# Patient Record
Sex: Female | Born: 1970 | Race: White | Hispanic: No | Smoking: Never smoker
Health system: Southern US, Community
[De-identification: ages and names within clinical notes are randomized; demographics above are authoritative.]

## PROBLEM LIST (undated history)

## (undated) DIAGNOSIS — G43909 Migraine, unspecified, not intractable, without status migrainosus: Secondary | ICD-10-CM

## (undated) HISTORY — PX: ABDOMINAL HYSTERECTOMY: SHX81

## (undated) HISTORY — PX: CHOLECYSTECTOMY: SHX55

---

## 1998-02-27 ENCOUNTER — Encounter: Admission: RE | Admit: 1998-02-27 | Discharge: 1998-05-28 | Payer: Self-pay | Admitting: Gynecology

## 1998-03-16 ENCOUNTER — Inpatient Hospital Stay (HOSPITAL_COMMUNITY): Admission: AD | Admit: 1998-03-16 | Discharge: 1998-03-16 | Payer: Self-pay | Admitting: Obstetrics and Gynecology

## 1998-03-18 ENCOUNTER — Inpatient Hospital Stay (HOSPITAL_COMMUNITY): Admission: AD | Admit: 1998-03-18 | Discharge: 1998-03-20 | Payer: Self-pay | Admitting: Gynecology

## 1998-04-30 ENCOUNTER — Other Ambulatory Visit: Admission: RE | Admit: 1998-04-30 | Discharge: 1998-04-30 | Payer: Self-pay | Admitting: Obstetrics and Gynecology

## 1999-07-25 ENCOUNTER — Other Ambulatory Visit: Admission: RE | Admit: 1999-07-25 | Discharge: 1999-07-25 | Payer: Self-pay | Admitting: Gynecology

## 1999-08-14 ENCOUNTER — Encounter: Admission: RE | Admit: 1999-08-14 | Discharge: 1999-11-12 | Payer: Self-pay | Admitting: Gynecology

## 2000-01-01 ENCOUNTER — Encounter: Payer: Self-pay | Admitting: Gynecology

## 2000-01-01 ENCOUNTER — Ambulatory Visit (HOSPITAL_COMMUNITY): Admission: RE | Admit: 2000-01-01 | Discharge: 2000-01-01 | Payer: Self-pay | Admitting: Gynecology

## 2000-01-14 ENCOUNTER — Inpatient Hospital Stay (HOSPITAL_COMMUNITY): Admission: AD | Admit: 2000-01-14 | Discharge: 2000-01-14 | Payer: Self-pay | Admitting: Gynecology

## 2000-01-28 ENCOUNTER — Inpatient Hospital Stay (HOSPITAL_COMMUNITY): Admission: RE | Admit: 2000-01-28 | Discharge: 2000-01-28 | Payer: Self-pay | Admitting: Gynecology

## 2000-01-28 ENCOUNTER — Encounter: Payer: Self-pay | Admitting: Gynecology

## 2000-02-03 ENCOUNTER — Inpatient Hospital Stay (HOSPITAL_COMMUNITY): Admission: AD | Admit: 2000-02-03 | Discharge: 2000-02-03 | Payer: Self-pay | Admitting: Gynecology

## 2000-02-09 ENCOUNTER — Inpatient Hospital Stay (HOSPITAL_COMMUNITY): Admission: AD | Admit: 2000-02-09 | Discharge: 2000-02-11 | Payer: Self-pay | Admitting: Gynecology

## 2000-03-22 ENCOUNTER — Other Ambulatory Visit: Admission: RE | Admit: 2000-03-22 | Discharge: 2000-03-22 | Payer: Self-pay | Admitting: Gynecology

## 2000-10-21 ENCOUNTER — Encounter: Payer: Self-pay | Admitting: Family Medicine

## 2000-10-21 ENCOUNTER — Ambulatory Visit (HOSPITAL_COMMUNITY): Admission: RE | Admit: 2000-10-21 | Discharge: 2000-10-21 | Payer: Self-pay | Admitting: Family Medicine

## 2001-06-06 ENCOUNTER — Other Ambulatory Visit: Admission: RE | Admit: 2001-06-06 | Discharge: 2001-06-06 | Payer: Self-pay | Admitting: Gynecology

## 2002-07-20 ENCOUNTER — Other Ambulatory Visit: Admission: RE | Admit: 2002-07-20 | Discharge: 2002-07-20 | Payer: Self-pay | Admitting: Gynecology

## 2002-09-13 ENCOUNTER — Encounter: Payer: Self-pay | Admitting: Family Medicine

## 2002-09-13 ENCOUNTER — Ambulatory Visit (HOSPITAL_COMMUNITY): Admission: RE | Admit: 2002-09-13 | Discharge: 2002-09-13 | Payer: Self-pay | Admitting: Family Medicine

## 2002-09-22 ENCOUNTER — Encounter (HOSPITAL_COMMUNITY): Admission: RE | Admit: 2002-09-22 | Discharge: 2002-10-22 | Payer: Self-pay | Admitting: Family Medicine

## 2002-09-22 ENCOUNTER — Encounter: Payer: Self-pay | Admitting: Family Medicine

## 2002-10-20 ENCOUNTER — Ambulatory Visit (HOSPITAL_COMMUNITY): Admission: RE | Admit: 2002-10-20 | Discharge: 2002-10-20 | Payer: Self-pay | Admitting: Internal Medicine

## 2002-10-24 ENCOUNTER — Ambulatory Visit (HOSPITAL_COMMUNITY): Admission: RE | Admit: 2002-10-24 | Discharge: 2002-10-24 | Payer: Self-pay | Admitting: Internal Medicine

## 2002-10-24 ENCOUNTER — Encounter (INDEPENDENT_AMBULATORY_CARE_PROVIDER_SITE_OTHER): Payer: Self-pay | Admitting: Internal Medicine

## 2003-03-29 ENCOUNTER — Encounter: Payer: Self-pay | Admitting: Family Medicine

## 2003-03-29 ENCOUNTER — Ambulatory Visit (HOSPITAL_COMMUNITY): Admission: RE | Admit: 2003-03-29 | Discharge: 2003-03-29 | Payer: Self-pay | Admitting: Family Medicine

## 2003-07-25 ENCOUNTER — Other Ambulatory Visit: Admission: RE | Admit: 2003-07-25 | Discharge: 2003-07-25 | Payer: Self-pay | Admitting: Gynecology

## 2004-04-02 ENCOUNTER — Ambulatory Visit (HOSPITAL_COMMUNITY): Admission: RE | Admit: 2004-04-02 | Discharge: 2004-04-02 | Payer: Self-pay | Admitting: Internal Medicine

## 2004-09-11 ENCOUNTER — Ambulatory Visit (HOSPITAL_COMMUNITY): Admission: RE | Admit: 2004-09-11 | Discharge: 2004-09-11 | Payer: Self-pay | Admitting: Family Medicine

## 2004-09-29 ENCOUNTER — Other Ambulatory Visit: Admission: RE | Admit: 2004-09-29 | Discharge: 2004-09-29 | Payer: Self-pay | Admitting: Gynecology

## 2005-09-16 ENCOUNTER — Ambulatory Visit: Payer: Self-pay | Admitting: Internal Medicine

## 2005-09-30 ENCOUNTER — Other Ambulatory Visit: Admission: RE | Admit: 2005-09-30 | Discharge: 2005-09-30 | Payer: Self-pay | Admitting: Gynecology

## 2006-06-11 ENCOUNTER — Ambulatory Visit (HOSPITAL_COMMUNITY): Admission: RE | Admit: 2006-06-11 | Discharge: 2006-06-11 | Payer: Self-pay | Admitting: Family Medicine

## 2006-10-04 ENCOUNTER — Other Ambulatory Visit: Admission: RE | Admit: 2006-10-04 | Discharge: 2006-10-04 | Payer: Self-pay | Admitting: Gynecology

## 2007-01-19 ENCOUNTER — Emergency Department (HOSPITAL_COMMUNITY): Admission: RE | Admit: 2007-01-19 | Discharge: 2007-01-19 | Payer: Self-pay | Admitting: Family Medicine

## 2007-10-06 ENCOUNTER — Other Ambulatory Visit: Admission: RE | Admit: 2007-10-06 | Discharge: 2007-10-06 | Payer: Self-pay | Admitting: Gynecology

## 2007-12-06 ENCOUNTER — Encounter: Payer: Self-pay | Admitting: Obstetrics and Gynecology

## 2007-12-06 ENCOUNTER — Ambulatory Visit (HOSPITAL_BASED_OUTPATIENT_CLINIC_OR_DEPARTMENT_OTHER): Admission: RE | Admit: 2007-12-06 | Discharge: 2007-12-07 | Payer: Self-pay | Admitting: Obstetrics and Gynecology

## 2007-12-28 ENCOUNTER — Ambulatory Visit (HOSPITAL_COMMUNITY): Admission: RE | Admit: 2007-12-28 | Discharge: 2007-12-28 | Payer: Self-pay | Admitting: Family Medicine

## 2008-07-16 ENCOUNTER — Ambulatory Visit (HOSPITAL_COMMUNITY): Admission: RE | Admit: 2008-07-16 | Discharge: 2008-07-16 | Payer: Self-pay | Admitting: Family Medicine

## 2008-07-23 ENCOUNTER — Ambulatory Visit (HOSPITAL_COMMUNITY): Admission: RE | Admit: 2008-07-23 | Discharge: 2008-07-23 | Payer: Self-pay | Admitting: Family Medicine

## 2008-10-01 ENCOUNTER — Ambulatory Visit (HOSPITAL_COMMUNITY): Admission: RE | Admit: 2008-10-01 | Discharge: 2008-10-01 | Payer: Self-pay | Admitting: General Surgery

## 2008-10-01 ENCOUNTER — Encounter (INDEPENDENT_AMBULATORY_CARE_PROVIDER_SITE_OTHER): Payer: Self-pay | Admitting: General Surgery

## 2008-12-28 ENCOUNTER — Inpatient Hospital Stay (HOSPITAL_COMMUNITY): Admission: EM | Admit: 2008-12-28 | Discharge: 2008-12-30 | Payer: Self-pay | Admitting: Emergency Medicine

## 2008-12-28 ENCOUNTER — Ambulatory Visit: Payer: Self-pay | Admitting: Gastroenterology

## 2008-12-29 ENCOUNTER — Ambulatory Visit: Payer: Self-pay | Admitting: Gastroenterology

## 2008-12-31 ENCOUNTER — Encounter: Payer: Self-pay | Admitting: Gastroenterology

## 2010-10-13 LAB — DIFFERENTIAL
Basophils Absolute: 0 10*3/uL (ref 0.0–0.1)
Basophils Relative: 0 % (ref 0–1)
Eosinophils Absolute: 0.1 10*3/uL (ref 0.0–0.7)
Lymphocytes Relative: 14 % (ref 12–46)
Lymphocytes Relative: 31 % (ref 12–46)
Lymphs Abs: 1.2 10*3/uL (ref 0.7–4.0)
Monocytes Relative: 5 % (ref 3–12)
Neutro Abs: 2.9 10*3/uL (ref 1.7–7.7)
Neutrophils Relative %: 80 % — ABNORMAL HIGH (ref 43–77)

## 2010-10-13 LAB — CBC
Hemoglobin: 14.4 g/dL (ref 12.0–15.0)
MCHC: 35.1 g/dL (ref 30.0–36.0)
Platelets: 172 10*3/uL (ref 150–400)
RDW: 13.6 % (ref 11.5–15.5)
RDW: 14 % (ref 11.5–15.5)
WBC: 5 10*3/uL (ref 4.0–10.5)

## 2010-10-13 LAB — COMPREHENSIVE METABOLIC PANEL
ALT: 27 U/L (ref 0–35)
Calcium: 9.3 mg/dL (ref 8.4–10.5)
GFR calc Af Amer: >60 mL/min (ref >60)
Glucose, Bld: 110 mg/dL — ABNORMAL HIGH (ref 70–99)
Sodium: 138 mEq/L (ref 135–145)
Total Protein: 6.8 g/dL (ref 6.0–8.3)

## 2010-10-13 LAB — BASIC METABOLIC PANEL
BUN: 4 mg/dL — ABNORMAL LOW (ref 6–23)
Calcium: 8.3 mg/dL — ABNORMAL LOW (ref 8.4–10.5)
Creatinine, Ser: 0.59 mg/dL (ref 0.4–1.2)
GFR calc non Af Amer: >60 mL/min (ref >60)
Potassium: 4.1 mEq/L (ref 3.5–5.1)

## 2010-10-13 LAB — URINALYSIS, ROUTINE W REFLEX MICROSCOPIC
Bilirubin Urine: NEGATIVE
Ketones, ur: 40 mg/dL — AB
Nitrite: NEGATIVE
Urobilinogen, UA: 0.2 mg/dL (ref 0.0–1.0)

## 2010-10-13 LAB — SEDIMENTATION RATE
Sed Rate: 1 mm/hr (ref 0–22)
Sed Rate: 5 mm/hr (ref 0–22)

## 2010-10-13 LAB — APTT: aPTT: 29 seconds (ref 24–37)

## 2010-10-13 LAB — PROTIME-INR
INR: 1 (ref 0.00–1.49)
Prothrombin Time: 13.3 seconds (ref 11.6–15.2)

## 2010-10-13 LAB — PREGNANCY, URINE: Preg Test, Ur: NEGATIVE

## 2010-10-13 LAB — HEPATIC FUNCTION PANEL
AST: 17 U/L (ref 0–37)
Albumin: 3.1 g/dL — ABNORMAL LOW (ref 3.5–5.2)
Bilirubin, Direct: 0.2 mg/dL (ref 0.0–0.3)

## 2010-10-13 LAB — C-REACTIVE PROTEIN
CRP: 0.2 mg/dL — ABNORMAL LOW (ref <0.6)
CRP: 0.2 mg/dL — ABNORMAL LOW (ref <0.6)

## 2010-10-16 LAB — TSH: TSH: 0.615 u[IU]/mL (ref 0.350–4.500)

## 2010-10-16 LAB — CBC
HCT: 34.6 % — ABNORMAL LOW (ref 36.0–46.0)
Hemoglobin: 12.1 g/dL (ref 12.0–15.0)
Platelets: 239 10*3/uL (ref 150–400)
RBC: 3.77 MIL/uL — ABNORMAL LOW (ref 3.87–5.11)
WBC: 5.1 10*3/uL (ref 4.0–10.5)

## 2010-10-16 LAB — BASIC METABOLIC PANEL
CO2: 27 mEq/L (ref 19–32)
Calcium: 9.2 mg/dL (ref 8.4–10.5)
GFR calc Af Amer: >60 mL/min (ref >60)
GFR calc non Af Amer: >60 mL/min (ref >60)
Glucose, Bld: 111 mg/dL — ABNORMAL HIGH (ref 70–99)
Potassium: 3.9 mEq/L (ref 3.5–5.1)
Sodium: 139 mEq/L (ref 135–145)

## 2010-10-16 LAB — HEPATIC FUNCTION PANEL
ALT: 22 U/L (ref 0–35)
AST: 26 U/L (ref 0–37)
Alkaline Phosphatase: 57 U/L (ref 39–117)
Bilirubin, Direct: 0.1 mg/dL (ref 0.0–0.3)

## 2010-11-18 NOTE — Op Note (Signed)
NAME:  Angel Wood, Angel Wood           ACCOUNT NO.:  [id number]   MEDICAL RECORD NO.:  [id number]          PATIENT TYPE:  AMB   LOCATION:  [facility]                         FACILITY:  [facility]   PHYSICIAN:  Daniel L. Gottsegen, M.D.DATE OF BIRTH:  May 25, 1971   DATE OF PROCEDURE:  12/06/2007  DATE OF DISCHARGE:                               OPERATIVE REPORT   PREOPERATIVE DIAGNOSES:  1. Uterine prolapse.  2. Cystocele.  3. Rectocele.   POSTOPERATIVE DIAGNOSES:  1. Uterine prolapse.  2. Cystocele.  3. Rectocele.   OPERATIONS:  1. Vaginal hysterectomy.  2. McCall's culdoplasty.  3. Anterior and posterior repair.  4. Repair of perineal body   SURGEON:  Daniel L. Eda Paschal, M.D.   FIRST ASSISTANT:  Juan H. Lily Peer, M.D.   INDICATIONS:  The patient is a 64 year old gravida 2, para 2, AB 0 who  had presented to the office with symptoms of uterine prolapse and  rectocele.  Symptoms included dyspareunia, feeling that her vagina had  stretched, some difficulty eliminating stool and feeling of the uterus  having dropped.  She had no specific bladder symptoms although she did  have a cystocele.  She was sent to the urologist who did not feel that a  sling procedure was appropriate as she had no stress incontinence and  did not feel that after we repaired all the above that she would have  incontinence.   FINDINGS:  Uterus was normal size and shape with second-degree prolapse.  The patient had a very lax anterior wall with a one and a half to second-  degree cystocele.  Her urethrovesical angle was well supported.  Her cul-  de-sac was large, wide and it appeared that she had the potential for an  enterocele postoperatively.  She had poor posterior support including a  rectocele and poor perineal body support.   PROCEDURE:  After adequate general endotracheal anesthesia, the patient  was placed in the dorsal lithotomy position, prepped and draped in usual  sterile manner.  A 1:200,000  solution of epinephrine and 0.5% Xylocaine  was injected around the cervix.  A 360 degree incision was made around  the cervix.  The bladder was mobilized superiorly as was the posterior  peritoneum.  Posterior peritoneum and vesicouterine fold of peritoneum  were entered with sharp dissection.  Uterosacral ligaments were clamped.  In clamping them, they were shortened and they were sutured to the vault  laterally for good vault support.  Cardinal ligaments, uterine arteries,  balance of the broad ligament, utero-ovarian ligaments and round  ligaments were successfully clamped, cut and suture ligated as was the  fallopian tubes.  Suture material was #1 chromic catgut.  Major vascular  bundles were doubly ligated.  The uterus was delivered and sent to  pathology for tissue diagnosis.  The vaginal cuff was sutured to the  posterior peritoneum with a running locking 0 Vicryl.  Copious  irrigation was done with sterile Ringer's lactate.  At this point, it  was felt that the patient needed support to prevent an enterocele.  Modified McCall's enterocele prevention sutures were placed with 3-0  Vicryl.  Three of  these were placed in order to eliminate the cul-de-sac  and this worked well.  They were not tied in place yet but instead  attention was turned to the anterior repair.  The anterior wall was  undermined to a level just below the urethrovesical angle.  A Foley  catheter was inserted which drained clear urine.  The cystocele and  vesicovaginal fascia was dissected free from the mucosa.  Excessive  mucosal skin was trimmed away and then the cystocele was reduced with  interrupteds of 2-0 Vicryl incorporating good fascia.  The anterior wall  was then closed with another 2-0 Vicryl also picking up the fascia to  eliminate dead space and to reinforce the repair.  At this point, the  vaginal cuff was then closed with figure-of-eights of #1 chromic catgut.  All the enterocele sutures were tied  in place and attention was next  turned to the posterior repair.  The patient had a poor perineal body as  well as a rectocele.  The vaginal mucosa was undermined to the top of  the cuff.  The perirectal fascia and the rectocele were sharply  dissected free.  Some of this dissection was done with the surgeon's  hand in the rectum and then he changed gloves.  The rectovaginal fascia  was brought together with interrupted 2-0 Vicryl after redundant vaginal  mucosa was trimmed away.  The posterior vaginal mucosa was then closed,  once again incorporating perirectal fascia to reduce dead space and to  support the repair.  When the perineum was gotten to, the perineal body  was then brought back together with interrupted 2-0 Vicryl and so, at  the termination of procedure, she had a good perineal body.  The skin  edges were brought together with subcuticular 3-0 Vicryl sutures.  Blood  loss for the entire procedure was 200 mL with none replaced.  At the  termination of the procedure, a pack was placed with 2-inch Iodoform.  She continued to drain clear urine throughout the procedure.  She left  the operating room in satisfactory condition.      Daniel L. Eda Paschal, M.D.  Electronically Signed     DLG/MEDQ  D:  12/06/2007  T:  12/06/2007  Job:  [id number]

## 2010-11-18 NOTE — Op Note (Signed)
NAME:  Angel Wood, Angel Wood NO.:  [id number]   MEDICAL RECORD NO.:  [id number]          PATIENT TYPE:  AMB   LOCATION:  DAY                           FACILITY:  [facility]   PHYSICIAN:  Dalia Heading, M.D.  DATE OF BIRTH:  1970/11/22   DATE OF PROCEDURE:  10/01/2008  DATE OF DISCHARGE:                               OPERATIVE REPORT   PREOPERATIVE DIAGNOSES:  Cholecystitis, cholelithiasis.   POSTOPERATIVE DIAGNOSES:  Cholecystitis, cholelithiasis.   PROCEDURE:  Laparoscopic cholecystectomy.   SURGEON:  Dalia Heading, MD   ANESTHESIA:  General endotracheal.   INDICATIONS:  The patient is a 40 year old white female who is referred  for evaluation and treatment of biliary colic secondary to  cholelithiasis.  The risks and benefits of the procedure including  bleeding, infection, hepatobiliary injury, and the possibly of an open  procedure were fully explained to the patient, gave informed consent.   PROCEDURE NOTE:  The patient was placed in the supine position.  After  induction of general endotracheal anesthesia, the abdomen was prepped  and draped in the usual sterile technique with DuraPrep.  Surgical site  confirmation was performed.   An infraumbilical incision was made down to the fascia.  A Veress needle  was introduced into the abdominal cavity and confirmation of placement  was done using the saline drop test.  The abdomen was then insufflated  to 16 mmHg pressure.  An 11-mm trocar was introduced into the abdominal  cavity under direct visualization without difficulty.  The patient was  placed in reverse Trendelenburg position.  An additional 11-mm trocar  was placed in the epigastric region and 5-mm trocars were placed in the  right upper quadrant right flank regions.  The liver was inspected and  noted to be within normal limits.  The gallbladder was retracted  superiorly and laterally.  The dissection was begun around the  infundibulum of the  gallbladder.  The cystic duct was first identified.  The juncture to the infundibulum fully identified.  endoclips placed  proximally and distally on the cystic duct and the cystic duct was  divided.  This likewise was done on the cystic artery.  The gallbladder  was then freed away from the gallbladder fossa using Bovie  electrocautery.  The gallbladder was delivered through the epigastric  trocar site using an EndoCatch bag.  The gallbladder fossa was  inspected.  No abnormal bleeding or bile leakage was noted.  The  Surgicel was placed in the gallbladder fossa.  All fluid and air were  then evacuated from the abdominal cavity prior to removal of the  trocars.   All wounds were irrigated with normal saline.  All wounds were injected  with 0.5 Sensorcaine.  The infraumbilical fascia as well as epigastric  fascia were reapproximated using 0 Vicryl interrupted sutures.  All skin  incisions were closed using staples.  Betadine ointment and dry sterile  dressings were applied.   All tape and needle counts were correct at the end of the procedure.  The patient was extubated in the operating room and went back to  recovery room in awake  and stable condition.  Complications none.   SPECIMEN:  Gallbladder.   ESTIMATED BLOOD LOSS:  Minimal.      Dalia Heading, M.D.  Electronically Signed     MAJ/MEDQ  D:  10/01/2008  T:  10/02/2008  Job:  [id number]   cc:   Patrica Duel, M.D.  Fax: [phone]

## 2010-11-18 NOTE — Discharge Summary (Signed)
NAME:  Angel Wood, Angel Wood           ACCOUNT NO.:  [id number]   MEDICAL RECORD NO.:  [id number]          PATIENT TYPE:  INP   LOCATION:  A313                          FACILITY:  [facility]   PHYSICIAN:  Melissa L. Ladona Ridgel, MD  DATE OF BIRTH:  1971-02-11   DATE OF ADMISSION:  12/27/2008  DATE OF DISCHARGE:  06/27/2010LH                               DISCHARGE SUMMARY   DISCHARGE DIAGNOSES:  1. Enteritis involving the terminal ileum.  The patient may have a      history of inflammatory bowel disease which was evaluated many      years ago.  She came into the hospital with abdominal discomfort,      nausea and vomiting for several days.  CT scans were confirmatory      for inflammation in the distal ileum.  She has responded favorably      to antibiotic therapy and rehydration.  She has been seen by GI who      will see her in the outpatient setting after appropriate course of      antibiotic treatment.  2. Possible small-bowel obstruction.  The patient actually did quite      well without any further evidence of obstructing pathology.  She      was able to have her diet advanced and was tolerating full diet at      the time of discharged.  3. History of migraine disease.  The patient did have one migraine      during this visit which responded to her Imitrex.  She will resume      her home medications of Topamax and Imitrex as directed by her      primary care physician.  4. The patient has a mild hypoalbuminemia.  She will be following with      GI as an outpatient and hopefully will be able to improve this over      time.  5. Nausea with vomiting.  This was resolved for 48 hours prior to her      discharge.  She will complete a course of 10 days of antibiotics      and follow up with GI for outpatient EGD.   DISCHARGE MEDICATIONS:  1. Hydrocodone/acetaminophen 7.5/500 every 4-6 hours p.r.n. for pain.      No prescription was given for this, as the patient does have      medication at  home.  2. Topamax 50 mg twice daily.  3. Cipro 500 mg by mouth twice daily x10 days.  4. Flagyl 500 mg by mouth every 8 hours x10 days.  5. Zofran ODT 4 mg by mouth every 4 hours as needed for nausea.  6. Imitrex 100 mg once daily as needed for headaches.  No prescription      was given for this.   FOLLOW UP:  The patient was instructed to follow up with Dr. Cira Servant in  approximately 2-3 weeks for outpatient EGD.   HOSPITAL COURSE:  The patient is a pleasant 40 year old white female  with a past medical history for a workup of possible inflammatory bowel  disease in the  past.  The patient presented to the emergency room with  24 hours of nausea, vomiting and diarrhea.  She evidently ate out at the  [facility] and subsequently 1 hour later developed bloating, severe  abdominal pain followed by sharp cramping-like sensation, 10/10 with  subsequent nausea and vomiting.  The patient treated herself with Gas-X  and Pepto-Bismol.  It did not relieve her symptoms, and when she had  vomited about three times, she decided to come to the emergency room for  further care.  As stated, the patient had been treated in 2005 by Dr.  Marcy Salvo and was originally told that perhaps she had Crohn's disease but  then this diagnosis was irritable bowel syndrome, as biopsy of the  terminal ileum did not show the presence of inflammation to support a  Crohn's diagnosis.  The patient was diagnosed with a hiatal hernia in  2004 and had some gastric polyps as well as gastric erosions in the  past.  The patient was admitted to the general medical floor.  She was  rehydrated and kept on bowel rest for 24 hours.  Her diet was advanced  slowly.  She was seen and evaluated by GI.  She was treated with  antibiotics including Cipro and Flagyl IV.  Subsequently the patient's  symptoms resolved.  She had no further abdominal pain or nausea.  She  was able to tolerate a full meal, and it was determined that she could   be seen as an outpatient for possible EGD and colonoscopy.   PHYSICAL EXAMINATION:  VITAL SIGNS:  On the day of discharge, the  patient's vital signs showed a T-max of 98.8, today 97.3, blood  pressures ranged between 96 and 109 without symptoms of dizziness.  This  is probably a baseline for her.  Her heart rates are 57 at their lowest  and 70 their highest.  Again, she is asymptomatic with these vital  signs; therefore, I suspect that she tends to run a little bit on the  lower side.  GENERAL:  This is moderately developed white female in no acute  distress.  HEENT:  She is normocephalic, atraumatic.  Pupils equal, round and  reactive to light.  Extraocular muscles intact.  She has anicteric  sclerae.  Septum is in the midline of her nose.  There are no changes to  her turbinates.  Mucous membranes are moist.  NECK:  Supple.  There is no JVD, no lymph nodes, no carotid bruits.  CHEST:  Clear to auscultation.  There is no rhonchi, rales or wheezes.  CARDIOVASCULAR:  Regular rate and rhythm, positive S1, S2.  No S3, S4.  No murmurs, rubs or gallops.  ABDOMEN:  Soft, nontender, nondistended with positive bowel sounds.  There is no hepatosplenomegaly and no guarding or rebound.  Bowel sounds  are present.  EXTREMITIES:  No clubbing, cyanosis or edema.  NEUROLOGIC:  She is awake, alert, oriented.  Cranial nerves II-XII  intact.  Power is 5/5.  DTRs are 2+.   PERTINENT LABORATORIES DURING THE COURSE OF THIS HOSPITAL STAY:  C-  reactive protein was 0.2.  ESR was 5.  Her last sodium was 139,  potassium 4.1, chloride 107, CO2 28, BUN 4, creatinine 0.59 and glucose  of 97.  Hepatic function was within normal limits, but her albumin was  slightly low at 3.1.  Lipase was negative at 27.  Her TSH was 0.615, and  her pregnancy test was negative.  T3 was 35.7  with a total T4 of 8.0.   At this time, the patient is deemed stable for discharge to follow at  home with Dr. Cira Servant.   DISPOSITION:   Home.   CONDITION:  Stable.   DISCHARGE INSTRUCTIONS:  The patient has been instructed to contact Dr.  Cira Servant' office for followup.  She has been given a script for Cipro,  Flagyl and Zofran.   Total time of this case was less than 30 minutes.      Melissa L. Ladona Ridgel, MD  Electronically Signed     MLT/MEDQ  D:  12/30/2008  T:  12/30/2008  Job:  [id number]

## 2010-11-18 NOTE — H&P (Signed)
NAMEMarland Kitchen  Angel Wood, Angel Wood NO.:  [id number]   MEDICAL RECORD NO.:  [id number]          PATIENT TYPE:  EMS   LOCATION:  ED                            FACILITY:  [facility]   PHYSICIAN:  Osvaldo Shipper, MD     DATE OF BIRTH:  May 06, 1971   DATE OF ADMISSION:  12/27/2008  DATE OF DISCHARGE:  LH                              HISTORY & PHYSICAL   PRIMARY CARE PHYSICIAN:  Patrica Duel, M.D.   ADMISSION DIAGNOSES:  1. Enteritis versus inflammatory bowel disease.  2. History of migraine headaches.  3. Partial small bowel obstruction versus ileus.   CHIEF COMPLAINT:  Abdominal pain since last night.   HISTORY OF PRESENT ILLNESS:  The patient is a 40 year old Caucasian  female who was in her usual state of health until yesterday evening when  she went to a restaurant called [employer] in [place].  She ate cheese  fries, salad, and ribs.  She started feeling bloated even at the  restaurant and could not finish her meal.  Then she went home and about  an hour later she started experiencing severe abdominal pain in the  center of the abdomen.  The pain was described as sharp, cramping-like  sensation on and off but present continuously.  It was 10/10 in  intensity and currently is 5/10.  Denies any fever but did have some  chills.  Denies any dysuria or hematuria.  Denies any vaginal discharge  or bleeding.  Pain was not relieved by Gas-X or Pepto Bismol.  She also  experienced nausea and vomiting about three times with food, yellowish  fluid, no blood.  Denies any diarrhea.  She had a normal bowel movement  this morning.  She had similar complaint about two years ago and  actually has been seen by gastroenterologist in the past for abdominal  pain which was thought to be irritable bowel syndrome.  She tells me  that she actually had a colonoscopy in 2005 done by Dr. Karilyn Cota at which  time according to the report in the computer there was no evidence for  Crohn's disease involving  the terminal ileum and the area was biopsied  and diverticula was noted and I did not see any mention of polyps that  were present.  She also has had an EGD in 2004 which showed a hiatal  hernia, gastric polyp, and some erosion.   MEDICATIONS AT HOME:  1. She is on Hydrocodone Acetaminophen 7.5/500mg  as needed.  2. Imitrex 100 mg as needed for migraines.  3. Topamax 50 mg b.i.d., but she has not taken this in one month.   ALLERGIES:  No known drug allergies.   PAST MEDICAL HISTORY:  1. Positive for migraine headaches.  2. She has had a hysterectomy for uterine prolapse.  3. She has had cholecystectomy in March of this year.  4. She has had varicose veins.   PROCEDURES:  Varicose veins in March of this year.  She denies other  surgeries.   SOCIAL HISTORY:  Lives in [place] with her husband.  Works as a Nature conservation officer.  No smoking, occasional  alcohol use, no illicit drug use.  Independent with her daily activities.   FAMILY HISTORY:  Positive for coronary artery disease in her father,  hypertension in her mother, otherwise unremarkable.   REVIEW OF SYSTEMS:  GENERAL:  Positive for weakness, malaise.  HEENT:  Unremarkable.  CARDIOVASCULAR:  Unremarkable.  GASTROINTESTINAL:  As in  HPI.  GENITOURINARY:  Unremarkable.  NEUROLOGIC:  Unremarkable.  PSYCHIATRIC:  Unremarkable.  MUSCULOSKELETAL:  Unremarkable.  DERMATOLOGIC:  Unremarkable.  Other systems unremarkable.   PHYSICAL EXAMINATION:  VITAL SIGNS:  Temperature 98.8, blood pressure  105/67, heart rate 68, respiratory rate 20, saturation 100% on room air.  GENERAL:  Caucasian female, well-nourished, well-developed, in no  distress.  HEENT:  There is no pallor, no icterus.  Oral mucosal membrane is moist.  Head is normocephalic, atraumatic.  NECK:  Soft and supple.  No thyromegaly is appreciated.  No  lymphadenopathy is noted.  LUNGS:  Clear to auscultation bilaterally.  No wheezing, rales, or  rhonchi.  CARDIOVASCULAR:   S1/S2 is normal, regular.  No S3/S4.  No rubs, no  bruits, no JVDs.  No pedal edema.  Peripheral pulses are palpable.  ABDOMEN:  Soft.  Tenderness in the lower part of the abdomen, more with  the right side.  No rebound, rigidity, or guarding.  Bowel sounds are  present, appear to be normal.  No masses or organomegaly is appreciated.  GENITOURINARY:  Examination deferred.  MUSCULOSKELETAL:  Exam unremarkable.  NEUROLOGIC:  She is alert, oriented x3.  No focal neurological deficits  are present.  SKIN:  Does not reveal any rashes.   LABORATORY DATA:  Her CBC shows normal white count with 80% neutrophils.  The rest of the parameters are normal.  Her CMET is also unremarkable.  Lipase is normal.  Urine pregnancy is negative.  UA showed specific  gravity greater than 1.03, small ketones, otherwise negative.  She had a  CT scan of the abdomen and pelvis which showed small amount of free  fluid about the liver.  There was also wall thickening of the terminal  ileum with prominent fat.  Scattered free fluid, and pelvic peritoneal  inflammatory changes were noted.  No abscess was seen.  Partial or early  SBO was also noted.   ASSESSMENT:  This is a 40 year old Caucasian female who presents with  abdominal pain.  This started after she ate at a restaurant last night.  She has abnormal CT findings such as enteritis.  Differential diagnoses  include infectious enteritis considering food intake last night.  This  could also be inflammatory bowel disease in the form of Crohn's.  It  appears that she has been evaluated for Crohn's in the past and it was  felt that it was not Crohn's that she had at that time.  She also has  evidence of partial or early small bowel obstruction versus ileus which  could be related to the inflammation.   PLAN:  1. Enteritis, likely infectious.  Will treat with antibiotics.      Consult GI to evaluate her.  It appears she may or may not require      an ileal  colonoscopy.  Pain control will be provided.  She will be      kept n.p.o.  IV fluids will be given.  2. Possible SBO, partial, which is early versus ileus.  We will keep      her n.p.o. at this time.  I will hold back on NG tube.  If  she      starts actively vomiting, an NG will be considered at that time.  3. History of migraine headaches, stable.  4. DVT prophylaxis will be initiated.   Further management decisions will depend on results of further testing  and patient's response to treatment.      Osvaldo Shipper, MD  Electronically Signed     GK/MEDQ  D:  12/28/2008  T:  12/28/2008  Job:  [id number]   cc:   Patrica Duel, M.D.  Fax: [phone]

## 2010-11-18 NOTE — Consult Note (Signed)
NAME:  NARISSA, BEAUFORT NO.:  [id number]   MEDICAL RECORD NO.:  [id number]          PATIENT TYPE:  INP   LOCATION:  A313                          FACILITY:  [facility]   PHYSICIAN:  Kassie Mends, M.D.      DATE OF BIRTH:  Aug 10, 1970   DATE OF CONSULTATION:  12/28/2008  DATE OF DISCHARGE:                                 CONSULTATION   REQUESTING PHYSICIAN:  Osvaldo Shipper, MD, [facility].   REASON FOR CONSULTATION:  Enteritis vs IBD.   PRIMARY CARE PHYSICIAN:  Patrica Duel, MD.   HISTORY OF PRESENT ILLNESS:  The patient is a very pleasant 40 year old  lady who presented to the emergency department yesterday evening with  complaints of severe abdominal cramping and pain.  She was in her usual  state of health until the evening prior to presentation.  She went out  to eat at [facility] in [place].  She ate cheese fries, salad and  ribs.  She started feeling bloated while she was at the restaurant,  could not finish her meal.  Within an hour she started experiencing  severe abdominal pain in the center of her abdomen which she describes  as crampy.  The symptoms persisted off and on.  The following morning  (yesterday) she had a normal bowel movement.  Her pain progressed  throughout the day and became more continuous, therefore she came to the  emergency department.  She did have nausea with vomiting.  Denies  hematemesis, melena or rectal bleeding.  Denies any fever or chills.  No  ill contacts.  She tried some Gas-X and Pepto-Bismol without any relief.  She has vomited yellowish food, but no blood, several times.  The last  time was this morning after she made it into her room.   She has previously been evaluated Dr. Karilyn Cota for abdominal pain.  She  has a history of IBS.  The last time we saw her was in 2007.  Back in  2005 she did undergo a colonoscopy because of abnormal-appearing small  bowel follow-through.  There was concern for a possibility of  Crohn's  disease.  However, on colonoscopy outside of focal mucosal edema and  erythema of the terminal ileum there was really no abnormalities.  The  pathology was nonspecific and did not suggest Crohn's disease.  She has  had 2 episodes of crampy abdominal pain since 2007.  Usually not  associated with any stooling.  Generally, she lays down and it is gone  an hour later.  She tells me, however, back earlier in the Spring she  started having more abdominal pain on the right side associated with  some loose stools.  She was found to have elevated LFTs and an abdominal  ultrasound showed a 7-mm gallstone.  She had her gallbladder removed by  Dr. Lovell Sheehan on October 01, 2008.  Pathology showed chronic cholecystitis,  cholelithiasis and cholesterolosis.  The patient states that she had  pretty much gotten back to normal except there are a few foods that  cause her to have loose stools.  Generally, she has regular  bowel  movements, no melena or rectal bleeding.  Generally, does not have  abdominal pain on a frequent basis.   MEDICATIONS AT HOME:  1. Imitrex 100 mg p.r.n. migraine headaches.  2. Topamax 50 mg b.i.d., but has not taken in the last 1 month.  3. Vicodin 7/5/500 mg p.r.n.   ALLERGIES:  No known drug allergies.   PAST MEDICAL HISTORY:  1. Migraine headaches.  2. Possible IBS.  3. History of GERD.   PAST SURGICAL HISTORY:  1. Cholecystectomy in March 2010, had gallstones.  2. Hysterectomy for uterine prolapse.  3. History of surgery for varicose veins.  4. She had a LEEP procedure in 1996 for cervical dysplasia.   FAMILY HISTORY:  Negative for Crohn's disease, IBD, colorectal cancer,  liver disease.   SOCIAL HISTORY:  She has recently divorced but currently engaged to be  married.  She just bought a new home.  She is starting a boarding  business for horses.  She is also a Therapist, nutritional.  Denies tobacco or drug  use.  Rarely consumes alcohol.   REVIEW OF SYSTEMS:  See  HPI.  CONSTITUTIONAL:  She reports a 25 pound  weight loss which began around October of last year at time of her  divorce.  She has gained about 20 pounds of it back over the last few  months, however.  CARDIOPULMONARY:  No chest pain, shortness of breath,  palpitations or cough.  GENITOURINARY:  No dysuria or hematuria.  PSYCH:  Denies any depression, anxiety.   PHYSICAL EXAM:  Temperature 98.6, pulse 77, respirations 20, blood  pressure 78/55, weight 62.5 kg, height 66 inches.  GENERAL:  A pleasant, thin Caucasian female in no acute distress.  SKIN:  Warm and dry, no jaundice.  HEENT:  Sclerae nonicteric.  Oropharyngeal mucosa moist and pink.  No  lesions, erythema or exudate.  No lymphadenopathy, thyromegaly.  CHEST:  Lungs clear to auscultation.  CARDIAC:  Regular rate and rhythm, no murmurs.  ABDOMEN:  Positive bowel sounds.  Abdomen soft, nondistended.  She has  moderate tenderness in the right abdomen to deep palpation, no rebound,  she has subjective guarding.  No organomegaly or mass is appreciated.  No abdominal bruits or hernias.  LOWER EXTREMITIES:  No edema.   LABS:  Sed rate 1, INR is 1, lipase 27.  Sodium 138, potassium 3.8, BUN  8, creatinine 0.65, glucose 110.  LFTs are normal.  White count 8600,  hemoglobin 14.4, platelets 223,000, hCG negative, TSH 0.615 back in  March.  CT of the abdomen and pelvis on December 27, 2008, showed a small  amount of free fluid around the liver, status post cholecystectomy.  She  had prominent wall thickening involving the terminal ileum.  She had  prominent fats surrounding the region as well.  Diverticulosis of the  sigmoid colon.  Inflammatory changes adjacent to the sigmoid colon  present.  Difficult to examine for wall thickening due to decompressed  sigmoid colon.  There is free fluid scattered throughout the mesentery  within the pelvis.  The ileal loops were slightly dilated proximal to  the area with some bowel wall thickening  worrisome for earlier partial  bowel obstruction.  Findings are worrisome for IBD or Crohn's disease  but infectious enteritis also a possibility.   IMPRESSION:  Patient is a pleasant 40 year old lady with basically acute  onset abdominal cramping associated with vomiting but no diarrhea.  CT  suggestive of irritable bowel disease versus infectious enteritis.  The  patient has had some vague symptoms earlier in the year which were felt  to be related to her gallbladder.  Previous colonoscopy in 2005 failed  to give diagnosis of irritable bowel disease at that time.  She had some  vague findings which were inconclusive on biopsy as outlined above.  At  this time given that she has been doing remarkably well and had acute  onset of symptoms it would be hard to believe that this could be  irritable bowel disease, but definitely needs to be excluded.  This may  be caused from infectious source.  I have discussed the case with Dr.  Cira Servant.  Would like to consider colonoscopy but, unfortunately, the  patient will unlikely tolerate the prep today given vomiting.   PLAN:  Will give her sips of clear liquids today.  Continue IV Cipro and  Flagyl to cover for infectious etiology.  Whenever she is taking by  mouth we can switch these over to oral form.  Continue PPI therapy.  Will add Carafate because of ongoing heartburn post vomiting.  We will  re-evaluate her in the morning and decide at that point when she would  be able to proceed with ileoscopy and biopsies.   ADDENDUM 81191:  Pt unable to tolerate Golytely. May need Osmoprep or  Miralax prep for TCS.      Tana Coast, P.A.      Kassie Mends, M.D.  Electronically Signed    LL/MEDQ  D:  12/28/2008  T:  12/28/2008  Job:  [id number]   cc:   Osvaldo Shipper, MD   Patrica Duel, M.D.  Fax: [phone]   [facility]
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
## 2010-11-18 NOTE — H&P (Signed)
NAME:  Angel Wood, Angel Wood NO.:  [id number]   MEDICAL RECORD NO.:  [id number]          PATIENT TYPE:  AMB   LOCATION:  DAY                           FACILITY:  [facility]   PHYSICIAN:  Dalia Heading, M.D.  DATE OF BIRTH:  27-Jan-1971   DATE OF ADMISSION:  DATE OF DISCHARGE:  LH                              HISTORY & PHYSICAL   CHIEF COMPLAINT:  Cholecystitis, cholelithiasis.   HISTORY OF PRESENT ILLNESS:  The patient is a 40 year old white female  who is referred for evaluation and treatment for biliary colic secondary  to cholelithiasis.  She has been having intermittent right upper  quadrant abdominal pain with radiation to the flank, nausea, and  indigestion over the past few weeks.  The symptoms are made worse with  fatty fluids.  No fever, chills, or jaundice have been noted.   PAST MEDICAL HISTORY:  1. Cervical arthritis.  2. Migraines.   PAST SURGICAL HISTORY:  Hysterectomy and vein surgery on her legs.   MEDICATIONS:  1. Topamax.  2. Imitrex as needed.  3. Xanax as needed.   ALLERGIES:  No known drug allergies.   REVIEW OF SYSTEMS:  Noncontributory.   PHYSICAL EXAMINATION:  GENERAL:  The patient is a well-developed, well-  nourished white female in no acute distress.  HEENT:  No scleral icterus.  LUNGS:  Clear to auscultation with equal breath sounds bilaterally.  HEART:  Regular rate and rhythm without S3, S4, or murmurs.  ABDOMEN:  Soft and nondistended.  She is tender in the right upper  quadrant to palpation.  No hepatosplenomegaly, masses, or hernias are  identified.   Ultrasound of the gallbladder reveals cholelithiasis with a normal  common bile duct.   IMPRESSION:  Cholecystitis, cholelithiasis.   PLAN:  The patient is scheduled for laparoscopic cholecystectomy on  October 01, 2008.  The risks and benefits of the procedure including  bleeding, infection, hepatobiliary injury, and the possibility of an  open procedure were fully explained to  the patient, gave informed  consent.      Dalia Heading, M.D.  Electronically Signed     MAJ/MEDQ  D:  09/27/2008  T:  09/28/2008  Job:  [id number]   cc:   Patrica Duel, M.D.  Fax: [phone]

## 2010-11-21 NOTE — Discharge Summary (Signed)
[facility]  Patient:    Angel Wood, Angel Wood                    MRN: [id number] Adm. Date:  [id number] Disc. Date: [id number] Attending:  Douglass Rivers Dictator:   Antony Contras, [facility]                           Discharge Summary  DISCHARGE DIAGNOSES:              1. Intrauterine pregnancy at term.                                   2. History of renal pyelectasis fetal                                      kidney demonstrated by ultrasound.  PROCEDURES,                       Normal spontaneous vaginal delivery of a viable infant.  HISTORY OF PRESENT ILLNESS:       The patient is a 40 year old, gravida 3, para 1-0-1-1, with LMP May 18, 1999, [facility] February 21, 2000.  Prenatal risk factors include Rh negative status, but the patients husband is also Rh negative, history of a renal pyelectasis discovered on ultrasound of fetal kidney.  The patient was offered amniocentesis and did decline this.  Further plan included followup for the infant postpartum with the recommendation of renal ultrasound before discharge.  PRENATAL LABORATORY DATA:         Blood type 0 negative, antibody screen negative.  RPR, HBsAg, HIV nonreactive.  Rubella immune.  MSAFP within normal limits.  HOSPITAL COURSE AND TREATMENT:    The patient was admitted on February 09, 2000, in labor.  Cervical 1 to 2 cm, 50%, -2, having contractions every 2 to 3 minutes  Artificial rupture of membranes revealed clear fluid.  She did progress to complete dilatation and delivered an Apgar 9/9 female infant weighing 8 pounds 7 ounces over an intact perineum.  She did have a small first degree midline laceration and a right labial laceration.  Postpartum course was within normal limits.  She remained afebrile, had no difficulty voiding, was not a candidate for RhoGAM since her husband is also Rh negative. Postpartum CBC: Hematocrit 36.6, hemoglobin 12.2, WBC 12.2, platelets 196.  DISPOSITION:                       Follow up in office in six weeks.  Continue prenatal vitamins and iron.  Motrin and Tylox for pain. DD:  02/27/00 TD:  02/29/00 Job: [id number]

## 2010-11-21 NOTE — H&P (Signed)
NAME:  Angel Wood, Angel Wood NO.:  [id number]   MEDICAL RECORD NO.:  [id number]                   PATIENT TYPE:   LOCATION:                                       FACILITY:   PHYSICIAN:  Lionel December, M.D.                 DATE OF BIRTH:  02/13/1971   DATE OF ADMISSION:  DATE OF DISCHARGE:                                HISTORY & PHYSICAL   REASON:  Colonoscopy with terminal ileoscopy, abnormal small-bowel follow  though, intermittent hematochezia.   HISTORY OF PRESENT ILLNESS:  This patient is a 40 year old Caucasian female  who has been followed by our office since March of 2003 with chronic GERD  symptoms.  Around April of last year she began to have upper and lower  abdominal pain and underwent a small bowel follow through which had subtle  changes associated with the distal ileum with an irregular mucosal pattern  and a slightly nodular mucosal pattern 10-15 cm proximal to the ileocecal  valve.  Symptomatically she was doing well, therefore, there was no further  workup regarding this.  She also underwent HIDA scan which revealed a  gallbladder ejection fraction of 67% as well as an abdominal ultrasound  which is negative.  She had an EGD by Dr. Karilyn Cota which revealed a small  sliding hiatal hernia, small gastric polyp which was ablated by a cold  biopsy and postbulbar erosions possibly related to NSAID use.  Biopsy was  benign.  LFTs have remained normal.   Today she comes in with concerns including persistent abdominal bloating,  intermittent diarrhea with up to 4 stools a day alternating with  constipation. She has noted some intermittent, small volume hematochezia on  the toilet paper post defecation. She also is complaining of rectal pain and  pruritus.  She denies any mucous in her stools.  She does have occasional  nausea.  She denies any vomiting.  She has had about 2 episodes a year of  severe mid abdominal pain which doubles her over for which  she must go to  bed and sleep off the pain.  She reports weight gain, steadily over the last  couple of months. She reports were thyroid was checked about a year ago  which was normal. She does note being under a significant amount of stress  as her parents are separated.  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  She is using hyoscyamine  0.375 mg which does help some with her symptoms as far as her reflux and  heartburn is concerned she is taking Zantac 75 mg on a p.r.n. basis which  helps about 80%.   PAST MEDICAL HISTORY:  1.  Allergic rhinitis and sinusitis.  2.  Cervical dysplasia for which she had a LEEP in 1996 and is currently      free of disease.  3.  Arthralgias.  CURRENT MEDICATIONS:  1.  Zantac 75 mg once or twice daily.  2.  Imitrex p.r.n.  3.  Sinus allergy p.r.n.  4.  Xanax 0.5 mg q.h.s.  5.  Migraine supplement p.r.n.  6.  Hyoscyamine 0.375 mg p.r.n.  7.  Multivitamin daily.  8.  2 tablespoons fiber powder daily.   ALLERGIES:  No known drug allergies.   FAMILY HISTORY:  Negative for Crohn's disease or inflammatory bowel disease.  No known family history of colorectal carcinoma, liver, or chronic GI  problems.   SOCIAL HISTORY:  The patient is currently married. She has 2 children.  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  She denies any tobacco, alcohol, or drug use.   REVIEW OF SYSTEMS:  CONSTITUTIONAL:  Weight steadily increasing.  Is  complaining of some fatigue. Denies any fever or chills.  CARDIOVASCULAR:  Denies any chest pain or palpitations.  PULMONARY:  Denies any shortness of  breath, dyspnea, cough or hemoptysis.  GASTROINTESTINAL:  See HPI.  Denies  any dysphagia or odynophagia.   PHYSICAL EXAMINATION:  VITAL SIGNS:  Weight 173.75 pounds which is up over  22 pounds since 1 year ago.  Blood pressure 100/80, pulse 84.  GENERAL:  This patient  is a 40 year old, obese, Caucasian female who is  alert, oriented, pleasant and cooperative.  HEENT:  Sclerae are clear.  Nonicteric.  Conjunctivae pink. Oropharynx pink  and moist without any lesions.  NECK:  Supple without any masses or thyromegaly.  HEART:  Regular rate and rhythm with normal S1-S2, without any murmurs,  clicks, rubs or gallops.  LUNGS:  Clear to auscultation bilaterally.  ABDOMEN:  Protuberant with positive bowel sounds x4.  No bruits auscultated.  Soft, nontender, nondistended without palpable mass or hepatosplenomegaly.  No rebound tenderness or guarding.  Negative Murphy's sign.  EXTREMITIES:  Good pedal pulses bilaterally.  No edema.  RECTAL:  She does have a small, less then 0.5 cm, protruding skin tag which  appears benign.  She also has slight perirectal erythema and a small rectal  fissure.  Good sphincter tone.  A small amount of light brown Hemoccult-  negative stool was obtained from the vault.  No internal masses palpated.  SKIN:  Pink, warm and dry, without any rash or jaundice.   ASSESSMENT:  1.  Patient is a 40 year old Caucasian female with over a 2-year history of      persistent abdominal bloating and abdominal pain.  She has undergone      extensive evaluation as dictated in the history of present illness.      Most recently she has noted intermittent hematochezia; and, given the      fact that she has history of abnormal small-bowel follow through with      changes in the terminal ileum, I feel that this needs to be evaluated to      rule out Crohn's disease.  She may have irritable bowel syndrome.  2.  As far as her chronic gastroesophageal reflux disease symptoms are      concerned prior esophagogastroduodenoscopy was reassuring.  She is not      responding well to over-the-counter Zantac therapy; therefore, would      recommend that she begin proton pump inhibitor therapy.   RECOMMENDATIONS: 1.  Prescription was given for Analpram 1% cream  to be applied to the      affected area t.i.d. for 2 weeks.  2.  Will scheduled colonoscopy with terminal ileoscopy with Dr. Karilyn Cota in      the near future.  I have discussed this procedure including risks and      benefits to include, but not limited to bleeding, infection,      perforation, or drug reaction.  She agrees with plan and consent will be      obtained.  3.  She has Prevacid samples at home and I have given her some Nexium      samples today.  She is to try once daily PPI therapy and let us know      which one is working best so that we may given her a prescription for      this.  4.  She believes that she has had recent CBC with Dr. Geanie Logan office and      therefore will request this as well.  5.  She can use hyoscyamine 0.375 mg p.r.n. and has some of this at home.  6.  Further recommendations pending procedure.  March 05, 2004     _____________________________________  ___________________________________________  Nicholas Lose, N.P.               Lionel December, M.D.   KC/MEDQ  D:  03/06/2004  T:  03/06/2004  Job:  [id number]   cc:   Patrica Duel, M.D.  [address], [address]  [place]  [place] [address]  Fax: [phone]

## 2010-11-21 NOTE — Op Note (Signed)
NAME:  Angel Wood, Angel Wood                     ACCOUNT NO.:  [id number]   MEDICAL RECORD NO.:  [id number]                   PATIENT TYPE:  AMB   LOCATION:  DAY                                  FACILITY:   PHYSICIAN:  Lionel December, M.D.                 DATE OF BIRTH:  [birth year]   DATE OF PROCEDURE:  10/20/2002  DATE OF DISCHARGE:                                  PROCEDURE NOTE   PROCEDURE:  Esophagogastroduodenoscopy.   INDICATION:  Angel Wood is a 40 year old Caucasian female with  intermittent/episodic epigastric right upper quadrant pain with nausea.  She  has not responded to a PPI.  A recent ultrasound and hepatobiliary scan were  negative.  There was no activity seen in the intestines.  On HIDA scan,  however, her bile duct diameter is normal and her LFTs are also normal.  Therefore, I am not convinced that she has biliary dyskinesia.  She has been  on Naprosyn and/or ibuprofen on an intermittent basis and, therefore, could  have peptic ulcer disease.  Procedure was reviewed with the patient and  informed consent was obtained.   PREOP MEDICATIONS:  1. Cetacaine spray for pharyngeal topical anesthesia.  2. Demerol 50 mg IV.  3. Versed 15 mg IV.   INSTRUMENT:  Olympus video system.   FINDINGS:  The procedure was performed in the endoscopy suite.  The  patient's vital signs and O2 sats were monitored during the procedure and  remained stable.   DESCRIPTION OF PROCEDURE:  The patient placed in the left lateral recumbent  position and endoscope was passed through the oropharynx without any  difficulty into the esophagus.   Esophagus:  Mucosa of the esophagus normal throughout.  Squamocolumnar  junction was unremarkable.  There was a small sliding hiatal hernia.   Stomach:  It was empty and distended very well on insufflation.  Folds in  the proximal stomach were normal.  Examination of the mucosa at the body  revealed a tiny polyp which was obliterated by cold biopsy.  Antral  mucosa  was normal.  Pyloric channel was patent.  Annularis and fundus were examined  by retroflexing the scope and were normal.   Duodenum:  Examination of the bulb revealed normal mucosa.  Scope was passed  to the second part of the duodenum where two erosions were noted.  Pictures  taken for the record and endoscope was withdrawn.  The patient tolerated the  procedure well.   FINAL DIAGNOSES:  1. Small, sliding, hiatal hernia without endoscopic evidence of reflux     esophagitis.  2. Incidental finding of small, gastric polyp which was obliterated by cold     biopsy.  3. Post-bulbar erosions.  I suspect that these may be related to     nonsteroidal anti-inflammatory drug use.  However, we need to further     evaluate her for Crohn disease.   RECOMMENDATIONS:  1. She will continue Protonix  40 mg p.o. q.a.m.  2. Small bowel study to be performed at [facility].  3. Should she experience another episode of severe pain, I would like her to     come to the lab to have LFTs drawn.  This needs to be done within 12 to     14 hours after onset of this pain.                                                Lionel December, M.D.    NR/MEDQ  D:  10/20/2002  T:  10/21/2002  Job:  [id number]   cc:   Patrica Duel, M.D.  [address], [address]  [place]  [place] [address]  Fax: [phone]

## 2010-11-21 NOTE — Op Note (Signed)
NAME:  Angel Wood, Angel Wood           ACCOUNT NO.:  [id number]   MEDICAL RECORD NO.:  [id number]          PATIENT TYPE:  AMB   LOCATION:  DAY                           FACILITY:  [facility]   PHYSICIAN:  Lionel December, M.D.    DATE OF BIRTH:  [birth year]   DATE OF PROCEDURE:  04/02/2004  DATE OF DISCHARGE:                                 OPERATIVE REPORT   PROCEDURE:  Total colonoscopy with terminal ileoscopy.   INDICATIONS:  Selena Batten is a 40 year old Caucasian female with recurrent lower  abdominal pain, bloating, and irregular bowel habits who is felt to have  IBS, but she has not responded to therapy. She also has symptoms of GERD and  had a good response with Nexium and would like to get a prescription. She  had small-bowel study recently which shows mucosa nodularity and edema.  These changes are felt to be suspicious for Crohn's disease. She is  therefore undergoing the study. Procedure and risks were reviewed with the  patient and informed consent was obtained.   PREOPERATIVE MEDICATIONS:  Demerol 50 mg IV, Versed 10 mg IV.   FINDINGS:  Procedure performed in endoscopy suite. The patient's vital signs  and O2 saturations were monitored during procedure and remained stable. The  patient was placed in the left lateral position and rectal examination  performed. No abnormality noted on external or digital exam. Olympus video  scope was placed in the rectum and advanced into sigmoid colon and beyond.  Preparation was satisfactory. Scope was advanced to the cecum which was  identified by appendiceal orifice and ileocecal valve. Picture taken for the  record. The scope was advanced into the terminal ileum for at least 15 cm.  Mucosa was normal except for focal area with edema and erythema, and it was  biopsied. Pictures also taken for the record. As the scope was withdrawn,  colonic mucosa was once again carefully examined, and there were no  abnormalities except there a few tiny diverticula at  the sigmoid colon. The  rectal mucosa similarly was normal. Scope was retroflexed to examine  anorectal junction which was normal. The scope was straightened and  withdrawn. The patient tolerated the procedure well.   FINAL DIAGNOSIS:  Normal examination. No evidence of Crohn's disease  involving terminal ileum. Focal mucosal edema and erythema which was  biopsied for histology. Change felt to be nonspecific until proven  otherwise.   Few small diverticula at sigmoid colon, otherwise normal colonoscopy.   RECOMMENDATIONS:  1.  She will continue antireflux measures.  2.  I would like for her to go back on Levbid half tablet every morning.  3.  Metamucil or Citrucel 3 to 4 g q.d.  4.  Colace 2 tablets at bedtime.  5.  I will be contacting patient with biopsy results for further      recommendations.  6.  She is also given prescription for Nexium 40 mg q.a.m., 30 with 5      refills.     Naje   NR/MEDQ  D:  04/02/2004  T:  04/02/2004  Job:  [id number]   cc:  Patrica Duel, M.D.  [address], [address]  [place]  [place] [address]  Fax: [phone]

## 2011-04-20 LAB — COMPREHENSIVE METABOLIC PANEL WITH GFR
ALT: 15
AST: 18
Alkaline Phosphatase: 50
CO2: 23
Calcium: 9
Chloride: 111
GFR calc Af Amer: >60
GFR calc non Af Amer: >60
Glucose, Bld: 97
Potassium: 3.4 — ABNORMAL LOW
Sodium: 142
Total Bilirubin: 0.6

## 2011-04-20 LAB — CBC
HCT: 39.7
Hemoglobin: 13.7
MCHC: 34.4
MCV: 91.9
Platelets: 258
RBC: 4.32
RDW: 13.1
WBC: 6.3

## 2011-04-20 LAB — DIFFERENTIAL
Basophils Absolute: 0
Basophils Relative: 0
Eosinophils Absolute: 0.2
Eosinophils Relative: 3
Lymphocytes Relative: 28
Lymphs Abs: 1.7
Monocytes Absolute: 0.4
Monocytes Relative: 6
Neutro Abs: 4
Neutrophils Relative %: 63

## 2011-04-20 LAB — WET PREP, GENITAL
Trich, Wet Prep: NONE SEEN
Yeast Wet Prep HPF POC: NONE SEEN

## 2011-04-20 LAB — URINALYSIS, ROUTINE W REFLEX MICROSCOPIC
Hgb urine dipstick: NEGATIVE
Nitrite: NEGATIVE
Specific Gravity, Urine: 1.015
Urobilinogen, UA: 0.2

## 2011-04-20 LAB — COMPREHENSIVE METABOLIC PANEL
Albumin: 3.8
BUN: 9
Creatinine, Ser: 0.67
Total Protein: 6.7

## 2011-04-20 LAB — URINE CULTURE: Culture: NO GROWTH

## 2011-04-20 LAB — GC/CHLAMYDIA PROBE AMP, GENITAL
Chlamydia, DNA Probe: NEGATIVE
GC Probe Amp, Genital: NEGATIVE

## 2011-04-20 LAB — LIPASE, BLOOD: Lipase: 30

## 2011-07-17 ENCOUNTER — Encounter (HOSPITAL_COMMUNITY): Payer: Self-pay | Admitting: Emergency Medicine

## 2011-07-17 ENCOUNTER — Emergency Department (HOSPITAL_COMMUNITY)
Admission: EM | Admit: 2011-07-17 | Discharge: 2011-07-17 | Disposition: A | Discharge status: Home / Self Care | Payer: Self-pay | Attending: Emergency Medicine | Admitting: Emergency Medicine

## 2011-07-17 ENCOUNTER — Emergency Department (HOSPITAL_COMMUNITY): Payer: Self-pay

## 2011-07-17 DIAGNOSIS — Z9889 Other specified postprocedural states: Secondary | ICD-10-CM | POA: Insufficient documentation

## 2011-07-17 DIAGNOSIS — G43909 Migraine, unspecified, not intractable, without status migrainosus: Secondary | ICD-10-CM | POA: Insufficient documentation

## 2011-07-17 DIAGNOSIS — R12 Heartburn: Secondary | ICD-10-CM | POA: Insufficient documentation

## 2011-07-17 DIAGNOSIS — R109 Unspecified abdominal pain: Secondary | ICD-10-CM | POA: Insufficient documentation

## 2011-07-17 DIAGNOSIS — D72829 Elevated white blood cell count, unspecified: Secondary | ICD-10-CM | POA: Insufficient documentation

## 2011-07-17 DIAGNOSIS — R10816 Epigastric abdominal tenderness: Secondary | ICD-10-CM | POA: Insufficient documentation

## 2011-07-17 DIAGNOSIS — Z9079 Acquired absence of other genital organ(s): Secondary | ICD-10-CM | POA: Insufficient documentation

## 2011-07-17 DIAGNOSIS — R112 Nausea with vomiting, unspecified: Secondary | ICD-10-CM | POA: Insufficient documentation

## 2011-07-17 HISTORY — DX: Migraine, unspecified, not intractable, without status migrainosus: G43.909

## 2011-07-17 LAB — URINALYSIS, ROUTINE W REFLEX MICROSCOPIC
Bilirubin Urine: NEGATIVE
Hgb urine dipstick: NEGATIVE
Ketones, ur: NEGATIVE mg/dL
Nitrite: NEGATIVE
pH: 8.5 — ABNORMAL HIGH (ref 5.0–8.0)

## 2011-07-17 LAB — COMPREHENSIVE METABOLIC PANEL
BUN: 11 mg/dL (ref 6–23)
CO2: 24 mEq/L (ref 19–32)
Calcium: 10.3 mg/dL (ref 8.4–10.5)
Creatinine, Ser: 0.65 mg/dL (ref 0.50–1.10)
GFR calc Af Amer: >90 mL/min (ref >90)
GFR calc non Af Amer: >90 mL/min (ref >90)
Glucose, Bld: 120 mg/dL — ABNORMAL HIGH (ref 70–99)

## 2011-07-17 LAB — CBC
HCT: 42.9 % (ref 36.0–46.0)
MCH: 31.1 pg (ref 26.0–34.0)
MCV: 88.8 fL (ref 78.0–100.0)
Platelets: 214 10*3/uL (ref 150–400)
RBC: 4.83 MIL/uL (ref 3.87–5.11)

## 2011-07-17 LAB — LIPASE, BLOOD: Lipase: 33 U/L (ref 11–59)

## 2011-07-17 MED ORDER — METOCLOPRAMIDE HCL 5 MG/ML IJ SOLN
10.0000 mg | Freq: Once | INTRAMUSCULAR | Status: AC
  Administered 2011-07-17: 10 mg via INTRAVENOUS
  Filled 2011-07-17: qty 2

## 2011-07-17 MED ORDER — HYDROMORPHONE HCL PF 1 MG/ML IJ SOLN
1.0000 mg | Freq: Once | INTRAMUSCULAR | Status: AC
  Administered 2011-07-17: 1 mg via INTRAVENOUS
  Filled 2011-07-17: qty 1

## 2011-07-17 MED ORDER — HYDROCODONE-ACETAMINOPHEN 5-325 MG PO TABS: 1.0000 | ORAL_TABLET | ORAL | Status: AC | PRN

## 2011-07-17 MED ORDER — CIPROFLOXACIN HCL 250 MG PO TABS
500.0000 mg | ORAL_TABLET | Freq: Once | ORAL | Status: AC
  Administered 2011-07-17: 500 mg via ORAL
  Filled 2011-07-17: qty 1

## 2011-07-17 MED ORDER — METRONIDAZOLE 500 MG PO TABS
500.0000 mg | ORAL_TABLET | Freq: Once | ORAL | Status: AC
  Administered 2011-07-17: 500 mg via ORAL
  Filled 2011-07-17: qty 1

## 2011-07-17 MED ORDER — SODIUM CHLORIDE 0.9 % IV BOLUS (SEPSIS)
1000.0000 mL | Freq: Once | INTRAVENOUS | Status: AC
  Administered 2011-07-17: 1000 mL via INTRAVENOUS

## 2011-07-17 MED ORDER — ONDANSETRON HCL 4 MG/2ML IJ SOLN
4.0000 mg | Freq: Once | INTRAMUSCULAR | Status: AC
  Administered 2011-07-17: 4 mg via INTRAVENOUS
  Filled 2011-07-17: qty 2

## 2011-07-17 MED ORDER — SODIUM CHLORIDE 0.9 % IV SOLN: Freq: Once | INTRAVENOUS | Status: DC

## 2011-07-17 MED ORDER — PROMETHAZINE HCL 25 MG PO TABS: 12.5000 mg | ORAL_TABLET | Freq: Four times a day (QID) | ORAL | Status: AC | PRN

## 2011-07-17 MED ORDER — METRONIDAZOLE 500 MG PO TABS: 500.0000 mg | ORAL_TABLET | Freq: Two times a day (BID) | ORAL | Status: AC

## 2011-07-17 MED ORDER — CIPROFLOXACIN HCL 500 MG PO TABS: 500.0000 mg | ORAL_TABLET | Freq: Two times a day (BID) | ORAL | Status: AC

## 2011-07-17 MED ORDER — ONDANSETRON 8 MG PO TBDP
8.0000 mg | ORAL_TABLET | Freq: Once | ORAL | Status: AC
  Administered 2011-07-17: 8 mg via ORAL
  Filled 2011-07-17: qty 1

## 2011-07-17 NOTE — ED Provider Notes (Signed)
History    This chart was scribed for [employer]. Colon Branch, MD, MD by Smitty Pluck. The patient was seen in room APA08 and the patient's care was started at 7:25AM.   CSN: [id number]  Arrival date & time 07/17/11  0630   First MD Initiated Contact with Patient 07/17/11 [phone]      Chief Complaint  Patient presents with  . Abdominal Pain  . Emesis    (Consider location/radiation/quality/duration/timing/severity/associated sxs/prior treatment) Patient is a 41 y.o. female presenting with abdominal pain and vomiting. The history is provided by the patient.  Abdominal Pain The primary symptoms of the illness include abdominal pain and vomiting.  Emesis  Associated symptoms include abdominal pain.   Angel Wood is a 41 y.o. female who presents to the Emergency Department complaining of moderate abdominal pain onset 1 day ago. Pt reports it woke her up out of her sleep and she states that the abdominal pain feels like cramping. The symptoms have been constant since onset. She has had emesis and nauseae onset 4 hours ago today. Pt has had this problem before and she said they told her that it was a infection of stomach and was given antibiotics. Also, pt has had headache for past several days. Pt denies diarrhea. She has had severe heart burn for the past 2 months.   PCP Dr. Regino Schultze  Past Medical History  Diagnosis Date  . Migraines     Past Surgical History  Procedure Date  . Cholecystectomy   . Abdominal hysterectomy     No family history on file.  History  Substance Use Topics  . Smoking status: Never Smoker   . Smokeless tobacco: Not on file  . Alcohol Use: No    OB History    Grav Para Term Preterm Abortions TAB SAB Ect Mult Living                  Review of Systems  Gastrointestinal: Positive for vomiting and abdominal pain.  All other systems reviewed and are negative.   10 Systems reviewed and are negative for acute change except as noted in the  HPI.  Allergies  Review of patient's allergies indicates no known allergies.  Home Medications   Current Outpatient Rx  Name Route Sig Dispense Refill  . HYDROCODONE-ACETAMINOPHEN 5-500 MG PO TABS Oral Take 1 tablet by mouth every 6 (six) hours as needed.    . SUMATRIPTAN SUCCINATE 100 MG PO TABS Oral Take 100 mg by mouth every 2 (two) hours as needed.    . TOPIRAMATE 50 MG PO TABS Oral Take 50 mg by mouth 2 (two) times daily.      BP 117/88  Pulse 88  Temp(Src) 97.8 F (36.6 C) (Oral)  Resp 18  Ht 5\' 6"  (1.676 m)  Wt 150 lb (68.04 kg)  BMI 24.21 kg/m2  SpO2 100%  Physical Exam  Nursing note and vitals reviewed. Constitutional: She is oriented to person, place, and time. She appears well-developed and well-nourished. No distress.  HENT:  Head: Normocephalic and atraumatic.  Right Ear: External ear normal.  Left Ear: External ear normal.  Nose: Nose normal.  Mouth/Throat: Oropharynx is clear and moist.  Eyes: EOM are normal. Pupils are equal, round, and reactive to light.  Neck: Normal range of motion. Neck supple. No tracheal deviation present. No thyromegaly present.  Cardiovascular: Normal rate, regular rhythm and normal heart sounds.  Exam reveals no friction rub.   No murmur heard. Pulmonary/Chest: Effort normal and  breath sounds normal. No respiratory distress. She has no wheezes. She has no rales.  Abdominal: Soft. Bowel sounds are normal. There is Tenderness: epigastric with palpation..  Neurological: She is alert and oriented to person, place, and time.  Skin: Skin is warm and dry.  Psychiatric: She has a normal mood and affect. Her behavior is normal.    ED Course  Procedures (including critical care time)  DIAGNOSTIC STUDIES: Oxygen Saturation is 100% on room air, normal by my interpretation.    COORDINATION OF CARE:  8:49AM Recheck: Pt reports having nausea and abdominal pain is back. EDP administers more medication for symptoms.   9:50AM Recheck: Pt  still has abdominal pain and nausea. EDP discusses lab and treatment course (abx and nausea medication). EDP gives medication for pain and nausea. Pt is ready for discharge.   Results for orders placed during the hospital encounter of 07/17/11  URINALYSIS, ROUTINE W REFLEX MICROSCOPIC      Component Value Range   Color, Urine YELLOW  YELLOW    APPearance HAZY (*) CLEAR    Specific Gravity, Urine 1.010  1.005 - 1.030    pH 8.5 (*) 5.0 - 8.0    Glucose, UA NEGATIVE  NEGATIVE (mg/dL)   Hgb urine dipstick NEGATIVE  NEGATIVE    Bilirubin Urine NEGATIVE  NEGATIVE    Ketones, ur NEGATIVE  NEGATIVE (mg/dL)   Protein, ur NEGATIVE  NEGATIVE (mg/dL)   Urobilinogen, UA 0.2  0.0 - 1.0 (mg/dL)   Nitrite NEGATIVE  NEGATIVE    Leukocytes, UA NEGATIVE  NEGATIVE   POCT PREGNANCY, URINE      Component Value Range   Preg Test, Ur NEGATIVE    CBC      Component Value Range   WBC 11.9 (*) 4.0 - 10.5 (K/uL)   RBC 4.83  3.87 - 5.11 (MIL/uL)   Hemoglobin 15.0  12.0 - 15.0 (g/dL)   HCT 16.1  09.6 - 04.5 (%)   MCV 88.8  78.0 - 100.0 (fL)   MCH 31.1  26.0 - 34.0 (pg)   MCHC 35.0  30.0 - 36.0 (g/dL)   RDW 40.9  81.1 - 91.4 (%)   Platelets 214  150 - 400 (K/uL)  COMPREHENSIVE METABOLIC PANEL      Component Value Range   Sodium 137  135 - 145 (mEq/L)   Potassium 4.0  3.5 - 5.1 (mEq/L)   Chloride 104  96 - 112 (mEq/L)   CO2 24  19 - 32 (mEq/L)   Glucose, Bld 120 (*) 70 - 99 (mg/dL)   BUN 11  6 - 23 (mg/dL)   Creatinine, Ser 7.82  0.50 - 1.10 (mg/dL)   Calcium 95.6  8.4 - 10.5 (mg/dL)   Total Protein 7.9  6.0 - 8.3 (g/dL)   Albumin 4.3  3.5 - 5.2 (g/dL)   AST 27  0 - 37 (U/L)   ALT 24  0 - 35 (U/L)   Alkaline Phosphatase 86  39 - 117 (U/L)   Total Bilirubin 0.5  0.3 - 1.2 (mg/dL)   GFR calc non Af Amer >90  >90 (mL/min)   GFR calc Af Amer >90  >90 (mL/min)  LIPASE, BLOOD      Component Value Range   Lipase 33  11 - 59 (U/L)    Dg Abd Acute W/chest  07/17/2011  *RADIOLOGY REPORT*  Clinical Data:  Nausea.  ACUTE ABDOMEN SERIES (ABDOMEN 2 VIEW & CHEST 1 VIEW)  Comparison: CT 12/27/2008.  Findings: Prior cholecystectomy.  The bowel gas pattern is normal. There is no evidence of free intraperitoneal air.  No suspicious radio-opaque calculi or other significant radiographic abnormality is seen. Heart size and mediastinal contours are within normal limits.  Both lungs are clear.  IMPRESSION: No acute findings.  Original Report Authenticated By: Cyndie Chime, M.D.        MDM  Patient with presentation of abdominal pain and vomiting since last night. Episode is similar to several previous episodes. CT 2010 showed a focal colitis. Labs today are unremarkable except for elevated wbc. Acute abdominal series is negative. Given IVF, analgesics and antiemetics. Required several doses of both to affect improvement. Will initiate antibiotic treatment for colitis.Pt feels improved after observation and/or treatment in ED.Pt stable in ED with no significant deterioration in condition.The patient appears reasonably screened and/or stabilized for discharge and I doubt any other medical condition or other [facility] requiring further screening, evaluation, or treatment in the ED at this time prior to discharge.  I personally performed the services described in this documentation, which was scribed in my presence. The recorded information has been reviewed and considered.  MDM Reviewed: previous chart, nursing note and vitals Reviewed previous: CT scan and ultrasound Interpretation: labs and x-ray        Nicoletta Dress. Colon Branch, MD 07/17/11 1011

## 2011-07-17 NOTE — ED Notes (Signed)
Patient c/o abdominal pain since last night; states has been vomiting since 0300.

## 2012-02-19 ENCOUNTER — Emergency Department (HOSPITAL_COMMUNITY)
Admission: EM | Admit: 2012-02-19 | Discharge: 2012-02-19 | Disposition: A | Discharge status: Home / Self Care | Payer: Self-pay | Attending: Emergency Medicine | Admitting: Emergency Medicine

## 2012-02-19 ENCOUNTER — Encounter (HOSPITAL_COMMUNITY): Payer: Self-pay | Admitting: Emergency Medicine

## 2012-02-19 DIAGNOSIS — L03211 Cellulitis of face: Secondary | ICD-10-CM | POA: Insufficient documentation

## 2012-02-19 DIAGNOSIS — G43909 Migraine, unspecified, not intractable, without status migrainosus: Secondary | ICD-10-CM | POA: Insufficient documentation

## 2012-02-19 DIAGNOSIS — L0201 Cutaneous abscess of face: Secondary | ICD-10-CM | POA: Insufficient documentation

## 2012-02-19 DIAGNOSIS — Z90710 Acquired absence of both cervix and uterus: Secondary | ICD-10-CM | POA: Insufficient documentation

## 2012-02-19 MED ORDER — DOXYCYCLINE HYCLATE 100 MG PO CAPS: 100.0000 mg | ORAL_CAPSULE | Freq: Two times a day (BID) | ORAL | Status: AC

## 2012-02-19 NOTE — ED Notes (Signed)
Pt c/o insect bite the left face yesterday.

## 2012-02-19 NOTE — ED Provider Notes (Signed)
History     CSN: [id number]  Arrival date & time 02/19/12  1624   None     Chief Complaint  Patient presents with  . Insect Bite  . Abscess    (Consider location/radiation/quality/duration/timing/severity/associated sxs/prior treatment) HPI Comments: Thinks an insect bit her on face 2 days ago.  She noted a small pustule and squeezed it.  It has since become re,swollen and more painful.  She has taken one doxycycline tab that she "had left over from a previous problem".  Patient is a 41 y.o. female presenting with abscess. The history is provided by the patient. No language interpreter was used.  Abscess  This is a new problem. Episode onset: 2 days ago. The problem has been gradually worsening. Affected Location: L face. The abscess is characterized by painfulness and swelling. The abscess first occurred at home. Pertinent negatives include no fever. There were no sick contacts. She has received no recent medical care.    Past Medical History  Diagnosis Date  . Migraines     Past Surgical History  Procedure Date  . Cholecystectomy   . Abdominal hysterectomy     History reviewed. No pertinent family history.  History  Substance Use Topics  . Smoking status: Never Smoker   . Smokeless tobacco: Not on file  . Alcohol Use: No    OB History    Grav Para Term Preterm Abortions TAB SAB Ect Mult Living                  Review of Systems  Constitutional: Negative for fever and chills.  Skin:       Cellulitis   All other systems reviewed and are negative.    Allergies  Review of patient's allergies indicates no known allergies.  Home Medications   Current Outpatient Rx  Name Route Sig Dispense Refill  . DIPHENHYDRAMINE HCL 25 MG PO CAPS Oral Take 25 mg by mouth as needed. For allergic reaction    . DOXYCYCLINE HYCLATE 100 MG PO CAPS Oral Take 100 mg by mouth once as needed. To counter-react allergic reaction    . MILNACIPRAN HCL 12.5 MG PO TABS Oral Take 12.5  mg by mouth 2 (two) times daily.    . SUMATRIPTAN SUCCINATE 100 MG PO TABS Oral Take 100 mg by mouth every 2 (two) hours as needed. For migraines    . DOXYCYCLINE HYCLATE 100 MG PO CAPS Oral Take 1 capsule (100 mg total) by mouth 2 (two) times daily. 20 capsule 0    BP 121/85  Pulse 96  Temp 99 F (37.2 C) (Oral)  Resp 20  Ht 5\' 6"  (1.676 m)  Wt 160 lb (72.576 kg)  BMI 25.82 kg/m2  SpO2 100%  Physical Exam  Nursing note and vitals reviewed. Constitutional: She is oriented to person, place, and time. She appears well-developed and well-nourished. No distress.  HENT:  Head: Normocephalic and atraumatic.    Eyes: EOM are normal.  Neck: Normal range of motion.  Cardiovascular: Normal rate, regular rhythm and normal heart sounds.   Pulmonary/Chest: Effort normal and breath sounds normal.  Abdominal: Soft. She exhibits no distension. There is no tenderness.  Musculoskeletal: Normal range of motion.  Lymphadenopathy:       Right cervical: No superficial cervical, no deep cervical and no posterior cervical adenopathy present.      Left cervical: No superficial cervical, no deep cervical and no posterior cervical adenopathy present.  Neurological: She is alert and oriented to person,  place, and time.  Skin: Skin is warm and dry.  Psychiatric: She has a normal mood and affect. Judgment normal.    ED Course  Procedures (including critical care time)  Labs Reviewed - No data to display No results found.   1. Cellulitis of face       MDM  rx-doxycycline, 20 Warm compresses F/u with dr. Regino Schultze prn        Evalina Field, PA 02/19/12 1732

## 2012-02-20 NOTE — ED Provider Notes (Signed)
Medical screening examination/treatment/procedure(s) were performed by non-physician practitioner and as supervising physician I was immediately available for consultation/collaboration.   Carleene Cooper III, MD 02/20/12 [phone]

## 2013-08-11 ENCOUNTER — Emergency Department (HOSPITAL_COMMUNITY): Payer: Medicaid Other

## 2013-08-11 ENCOUNTER — Encounter (HOSPITAL_COMMUNITY): Payer: Self-pay | Admitting: Emergency Medicine

## 2013-08-11 ENCOUNTER — Emergency Department (HOSPITAL_COMMUNITY)
Admission: EM | Admit: 2013-08-11 | Discharge: 2013-08-11 | Disposition: A | Discharge status: Home / Self Care | Payer: Medicaid Other | Attending: Emergency Medicine | Admitting: Emergency Medicine

## 2013-08-11 DIAGNOSIS — S0993XA Unspecified injury of face, initial encounter: Secondary | ICD-10-CM | POA: Insufficient documentation

## 2013-08-11 DIAGNOSIS — S199XXA Unspecified injury of neck, initial encounter: Secondary | ICD-10-CM

## 2013-08-11 DIAGNOSIS — Y929 Unspecified place or not applicable: Secondary | ICD-10-CM | POA: Insufficient documentation

## 2013-08-11 DIAGNOSIS — S2220XA Unspecified fracture of sternum, initial encounter for closed fracture: Secondary | ICD-10-CM

## 2013-08-11 DIAGNOSIS — G43909 Migraine, unspecified, not intractable, without status migrainosus: Secondary | ICD-10-CM | POA: Insufficient documentation

## 2013-08-11 DIAGNOSIS — Y9389 Activity, other specified: Secondary | ICD-10-CM | POA: Insufficient documentation

## 2013-08-11 DIAGNOSIS — R110 Nausea: Secondary | ICD-10-CM | POA: Insufficient documentation

## 2013-08-11 DIAGNOSIS — S0990XA Unspecified injury of head, initial encounter: Secondary | ICD-10-CM | POA: Insufficient documentation

## 2013-08-11 DIAGNOSIS — Z79899 Other long term (current) drug therapy: Secondary | ICD-10-CM | POA: Insufficient documentation

## 2013-08-11 MED ORDER — MORPHINE SULFATE 4 MG/ML IJ SOLN
4.0000 mg | Freq: Once | INTRAMUSCULAR | Status: AC
  Administered 2013-08-11: 4 mg via INTRAVENOUS
  Filled 2013-08-11: qty 1

## 2013-08-11 MED ORDER — IOHEXOL 300 MG/ML  SOLN
80.0000 mL | Freq: Once | INTRAMUSCULAR | Status: AC | PRN
  Administered 2013-08-11: 80 mL via INTRAVENOUS

## 2013-08-11 MED ORDER — ONDANSETRON HCL 4 MG/2ML IJ SOLN
4.0000 mg | Freq: Once | INTRAMUSCULAR | Status: AC
  Administered 2013-08-11: 4 mg via INTRAVENOUS
  Filled 2013-08-11: qty 2

## 2013-08-11 MED ORDER — HYDROCODONE-ACETAMINOPHEN 5-325 MG PO TABS
1.0000 | ORAL_TABLET | Freq: Once | ORAL | Status: AC
  Administered 2013-08-11: 1 via ORAL
  Filled 2013-08-11: qty 1

## 2013-08-11 MED ORDER — KETOROLAC TROMETHAMINE 30 MG/ML IJ SOLN
30.0000 mg | Freq: Once | INTRAMUSCULAR | Status: AC
  Administered 2013-08-11: 30 mg via INTRAVENOUS
  Filled 2013-08-11: qty 1

## 2013-08-11 MED ORDER — OXYCODONE-ACETAMINOPHEN 5-325 MG PO TABS: 1.0000 | ORAL_TABLET | ORAL | Status: DC | PRN

## 2013-08-11 MED ORDER — IBUPROFEN 600 MG PO TABS: 600.0000 mg | ORAL_TABLET | Freq: Four times a day (QID) | ORAL | Status: DC | PRN

## 2013-08-11 NOTE — ED Notes (Signed)
Pt was on horse , struck a guide wire on a pole and knocked off the horse. Neck , lt shoulder , lt arm pain.  Injury occurred on Sunday, Has not had medical tx since then. Nausea since then   Lt upper trunk hurts

## 2013-08-11 NOTE — ED Notes (Addendum)
Per patient fell off horse after "getting close lined by guide wire/cable" on Sunday hitting. Patient denies hitting head or LOC. Patient C/o neck pain and left rib pain. Per patient hurts to take a deep breath. Denies any difficulty breathing or SOB.  Patient left ring finger black in color and swollen. Patient reports numbness and tenderness.

## 2013-08-11 NOTE — Discharge Instructions (Signed)
Sternal Fracture The sternum is the bone in the center of the front of your chest which your ribs attach to. It is also called the breastbone. The most common cause of a sternal fracture (break in the bone) is an injury. The most common injury is from a motor vehicle accident. The fracture often comes from the seatbelt or hitting the chest on the steering wheel or being forcibly bent forward (shoulders towards your knees) during an accident. It is more common in females and the elderly. The fracture of the sternum is usually not a problem if there are no other injuries. Other injuries that may happen are to the ribs, heart, lungs, and abdominal organs. SYMPTOMS  Common complaints from a fracture of the sternum include:  Shortness of breath.  Pain with breathing or difficulty breathing.  Bruises about the chest.  Tenderness or a cracking sound at the breastbone. DIAGNOSIS  Your caregiver may be able to tell if the sternum is broken by examining you. Other times studies such as X-ray, CAT scan, ultrasound, and nuclear medicine are used to detect a fracture.  TREATMENT   Sternal fractures usually are not serious and if displacement is minimal, no treatment is necessary.  The main concern is with damage to the surrounding structures: ribs, heart, great vessels coming from the heart, and the back bone in the chest area.  Multiple rib fractures may cause breathing difficulties.  Injury to one of the large vessels in the chest may be a threat to life and require immediate surgery.  If injury to the heart or lungs is suspected it may be necessary to stay in the hospital and be monitored.  Other injuries will be treated as needed.  If the pieces of the breastbone are out of normal position, they may need to be reduced (put back in position) and then wired in place or fixed with a plate and screws during an operation. HOME CARE INSTRUCTIONS   Avoid strenuous activity. Be careful during  activities and avoid bumping or re-injuring the injured sternum. Activities that cause pain pull on the fracture site(s) and are best avoided if possible.  Eat a normal, well-balanced diet. Drink plenty of fluids to avoid constipation, a common side effect of pain medications.  Take deep breaths and cough several times a day, splinting the injured area with a pillow. This will help prevent pneumonia.  Do not wear a rib belt or binder for the chest unless instructed otherwise. These restrict breathing and can lead to pneumonia.  Only take over-the-counter or prescription medicines for pain, discomfort, or fever as directed by your caregiver. SEEK MEDICAL CARE IF:  You develop a continual cough, associated with thick or bloody mucus or phlegm (sputum). SEEK IMMEDIATE MEDICAL CARE IF:   You have a fever.  You have increasing difficulty breathing.  You feel sick to your stomach (nausea), vomit, or have abdominal pain.  You have worsening pain, not controlled with medications.  You develop pain in the tops of your shoulders (in the shoulder strap area).  You feel lightheaded or faint.  You develop chest pain or an abnormal heart beat (palpitations).  You develop pain radiating into the jaw, teeth or down the arms. Document Released: 02/04/2004 Document Revised: 09/14/2011 Document Reviewed: 09/24/2008 [facility] Patient Information 2014 [place], [place].   Do not drive within 4 hours of taking oxycodone as this medication will make you drowsy.

## 2013-08-11 NOTE — ED Provider Notes (Signed)
CSN: [id number]     Arrival date & time 08/11/13  1448 History   First MD Initiated Contact with Patient 08/11/13 1610     Chief Complaint  Patient presents with  . Fall   (Consider location/radiation/quality/duration/timing/severity/associated sxs/prior Treatment) HPI Comments: Angel Wood is a 43 y.o. Female presenting for evaluation of injury sustained in a fall 5 days ago when she was "clotheslined across her right neck and chest while horseback riding.  She reports going at a moderate speed, but was not galloping when the fall occurred, landing on bare dirt, denies hitting her head and denies LOC, but has had a headache with intermittent nausea since the injury. She has pain in her left hand, left elbow and shoulder,  Also across chest and localized pain in her lower sternum.  She denies sob but does have pain with deep inspiration and with ROM and palpation of her shoulder and neck.  She is taking ibuprofen with transient relief of symptoms.     The history is provided by the patient.    Past Medical History  Diagnosis Date  . Migraines    Past Surgical History  Procedure Laterality Date  . Cholecystectomy    . Abdominal hysterectomy     No family history on file. History  Substance Use Topics  . Smoking status: Never Smoker   . Smokeless tobacco: Never Used  . Alcohol Use: No   OB History   Grav Para Term Preterm Abortions TAB SAB Ect Mult Living   3 2 2  1     2      Review of Systems  Constitutional: Negative for fever.  HENT: Negative for congestion and sore throat.   Eyes: Negative.   Respiratory: Negative for chest tightness and shortness of breath.   Cardiovascular: Negative for chest pain.  Gastrointestinal: Positive for nausea. Negative for abdominal pain.  Genitourinary: Negative.   Musculoskeletal: Positive for arthralgias and neck pain. Negative for joint swelling.  Skin: Positive for color change. Negative for rash and wound.  Neurological:  Positive for headaches. Negative for dizziness, weakness, light-headedness and numbness.  Psychiatric/Behavioral: Negative.     Allergies  Review of patient's allergies indicates no known allergies.  Home Medications   Current Outpatient Rx  Name  Route  Sig  Dispense  Refill  . ALPRAZolam (XANAX) 1 MG tablet   Oral   Take 1 mg by mouth at bedtime as needed for sleep.         . DULoxetine (CYMBALTA) 60 MG capsule   Oral   Take 60 mg by mouth at bedtime.         . SUMAtriptan (IMITREX) 100 MG tablet   Oral   Take 100 mg by mouth every 2 (two) hours as needed. For migraines         . topiramate (TOPAMAX) 50 MG tablet   Oral   Take 50 mg by mouth 2 (two) times daily.         Marland Kitchen ibuprofen (ADVIL,MOTRIN) 600 MG tablet   Oral   Take 1 tablet (600 mg total) by mouth every 6 (six) hours as needed.   30 tablet   0   . oxyCODONE-acetaminophen (PERCOCET/ROXICET) 5-325 MG per tablet   Oral   Take 1 tablet by mouth every 4 (four) hours as needed for severe pain.   30 tablet   0    BP 136/89  Pulse 77  Temp(Src) 98 F (36.7 C) (Oral)  Resp 16  Ht 5\' 9"  (1.753 m)  Wt 140 lb (63.504 kg)  BMI 20.67 kg/m2  SpO2 100% Physical Exam  Nursing note and vitals reviewed. Constitutional: She is oriented to person, place, and time. She appears well-developed and well-nourished.  HENT:  Head: Normocephalic and atraumatic.  Mouth/Throat: Oropharynx is clear and moist.  Eyes: EOM are normal. Pupils are equal, round, and reactive to light.  Neck: Normal range of motion. Neck supple.  Cardiovascular: Normal rate and normal heart sounds.   Pulses equal bilaterally  Pulmonary/Chest: Effort normal.    Mild superficial linear abrasion from right lateral neck across chest to left breast.  No crepitus.  Abdominal: Soft. There is no tenderness.  Musculoskeletal: Normal range of motion. She exhibits tenderness.       Left shoulder: She exhibits bony tenderness. She exhibits no  swelling, no effusion, no crepitus, no deformity and normal pulse.       Cervical back: She exhibits bony tenderness. She exhibits normal range of motion, no swelling and no edema.  Lymphadenopathy:    She has no cervical adenopathy.  Neurological: She is alert and oriented to person, place, and time. She has normal strength. She displays normal reflexes. No sensory deficit. Gait normal. GCS eye subscore is 4. GCS verbal subscore is 5. GCS motor subscore is 6.  Equal strength  Skin: Skin is warm and dry. No rash noted.  Ecchymosis left ring finger,  No edema.   Psychiatric: She has a normal mood and affect. Her speech is normal and behavior is normal. Thought content normal. Cognition and memory are normal.    ED Course  Procedures (including critical care time) Labs Review Labs Reviewed - No data to display Imaging Review Dg Chest 2 View  08/11/2013   ADDENDUM REPORT: 08/11/2013 17:17  ADDENDUM: A nondisplaced fracture of the upper sternum is visible on the lateral view, better seen on the dedicated lateral view of the sternum. There is no retrosternal hematoma.   Electronically Signed   By: Roxy HorsemanBill  Veazey M.D.   On: 08/11/2013 17:17   08/11/2013   CLINICAL DATA:  Chest and sternal pain.  Injury 5 days ago.  EXAM: CHEST  2 VIEW  COMPARISON:  None.  FINDINGS: The heart size and mediastinal contours are normal. The lungs are clear. There is no pleural effusion or pneumothorax. No acute osseous findings are identified. No definite sternal abnormality or retrosternal swelling identified. There are mild degenerative changes in the thoracic spine associated with a mild convex right scoliosis. Cholecystectomy clips are noted.  IMPRESSION: No active cardiopulmonary process or posttraumatic findings demonstrated.  Electronically Signed: By: Roxy HorsemanBill  Veazey M.D. On: 08/11/2013 17:14   Dg Sternum  08/11/2013   CLINICAL DATA:  Chest and sternal pain following injury 5 days ago.  EXAM: STERNUM - 2+ VIEW   COMPARISON:  None.  FINDINGS: There is a nondisplaced fracture of the upper sternal body. No significant retrosternal hematoma is identified.  IMPRESSION: Nondisplaced fracture of the upper sternal body.   Electronically Signed   By: Roxy HorsemanBill  Veazey M.D.   On: 08/11/2013 17:16   Dg Cervical Spine Complete  08/11/2013   CLINICAL DATA:  Left neck pain following trauma 5 days ago.  EXAM: CERVICAL SPINE  4+ VIEWS  COMPARISON:  MR C SPINE W/O CM dated 07/23/2008; DG CERVICAL SPINE COMPLETE dated 07/16/2008  FINDINGS: The prevertebral soft tissues are normal. The alignment is anatomic through T1. There is no evidence of acute fracture or traumatic subluxation. The C1-2  articulation appears normal in the AP projection. There is mildly progressive disc space loss with uncinate spurring at C4-5 and C5-6. There is no high-grade osseous foraminal stenosis.  IMPRESSION: Mild spondylosis at C4-5 and C5-6. No evidence of acute cervical spine fracture, traumatic subluxation or static signs of instability.   Electronically Signed   By: Roxy Horseman M.D.   On: 08/11/2013 17:12   Dg Elbow Complete Left  08/11/2013   CLINICAL DATA:  Elbow pain following injury 5 days ago.  EXAM: LEFT ELBOW - COMPLETE 3+ VIEW  COMPARISON:  None.  FINDINGS: The mineralization and alignment are normal. There is no evidence of acute fracture or dislocation. The joint spaces are maintained. There is no evidence of elbow joint effusion or foreign body.  IMPRESSION: No acute osseous findings.   Electronically Signed   By: Roxy Horseman M.D.   On: 08/11/2013 17:18   Ct Chest W Contrast  08/11/2013   CLINICAL DATA:  Chest pain following recent injury, known sternal fracture  EXAM: CT CHEST WITH CONTRAST  TECHNIQUE: Multidetector CT imaging of the chest was performed during intravenous contrast administration.  CONTRAST:  80mL OMNIPAQUE IOHEXOL 300 MG/ML  SOLN  COMPARISON:  None.  FINDINGS: The lungs are well aerated bilaterally without focal infiltrate or  sizable effusion. No pneumothorax is noted. The thoracic inlet is within normal limits. An undisplaced proximal sternal fracture is seen. No mediastinal hematoma is seen. No underlying vascular abnormality is noted. The visualized portions of the upper abdomen are within normal limits. No other bony abnormality is seen.  IMPRESSION: Undisplaced proximal sternal fracture. No other focal abnormality is noted.   Electronically Signed   By: Alcide Clever M.D.   On: 08/11/2013 18:59   Dg Hand Complete Left  08/11/2013   CLINICAL DATA:  Larey Seat from horse back, hand pain especially at ring finger  EXAM: LEFT HAND - COMPLETE 3+ VIEW  COMPARISON:  None  FINDINGS: Osseous mineralization normal.  Joint spaces preserved.  No acute fracture, dislocation or bone destruction.  No significant soft tissue abnormalities.  IMPRESSION: No acute osseous abnormalities.   Electronically Signed   By: Ulyses Southward M.D.   On: 08/11/2013 17:13    EKG Interpretation   None       MDM   1. Fracture, sternum closed    Patients labs and/or radiological studies were viewed and considered during the medical decision making and disposition process.  Pt's injuries discussed with Dr. Deretha Emory.  Prescribed oxycodone, ibuprofen, encouraged heating pad,  Minimize heavy lifting, activity that worsens pain.  Advised close f/u for fever, sob, increased cough.   Burgess Amor, PA-C 08/11/13 [id number]

## 2013-08-12 NOTE — ED Provider Notes (Signed)
Medical screening examination/treatment/procedure(s) were performed by non-physician practitioner and as supervising physician I was immediately available for consultation/collaboration.  EKG Interpretation   None         Maricel Swartzendruber W. Grae Leathers, MD 08/12/13 2255 

## 2014-05-07 ENCOUNTER — Encounter (HOSPITAL_COMMUNITY): Payer: Self-pay | Admitting: Emergency Medicine

## 2015-04-03 ENCOUNTER — Encounter (HOSPITAL_COMMUNITY): Payer: Self-pay | Admitting: *Deleted

## 2015-04-03 ENCOUNTER — Emergency Department (HOSPITAL_COMMUNITY)
Admission: EM | Admit: 2015-04-03 | Discharge: 2015-04-03 | Disposition: A | Discharge status: Home / Self Care | Attending: Emergency Medicine | Admitting: Emergency Medicine

## 2015-04-03 DIAGNOSIS — H65193 Other acute nonsuppurative otitis media, bilateral: Secondary | ICD-10-CM

## 2015-04-03 DIAGNOSIS — G43909 Migraine, unspecified, not intractable, without status migrainosus: Secondary | ICD-10-CM | POA: Insufficient documentation

## 2015-04-03 DIAGNOSIS — H65191 Other acute nonsuppurative otitis media, right ear: Secondary | ICD-10-CM | POA: Insufficient documentation

## 2015-04-03 DIAGNOSIS — H65192 Other acute nonsuppurative otitis media, left ear: Secondary | ICD-10-CM | POA: Insufficient documentation

## 2015-04-03 DIAGNOSIS — Z79899 Other long term (current) drug therapy: Secondary | ICD-10-CM | POA: Insufficient documentation

## 2015-04-03 MED ORDER — ONDANSETRON 8 MG PO TBDP
8.0000 mg | ORAL_TABLET | Freq: Once | ORAL | Status: AC
  Administered 2015-04-03: 8 mg via ORAL
  Filled 2015-04-03: qty 1

## 2015-04-03 MED ORDER — ONDANSETRON HCL 4 MG PO TABS: 4.0000 mg | ORAL_TABLET | Freq: Four times a day (QID) | ORAL | Status: AC

## 2015-04-03 MED ORDER — HYDROCODONE-ACETAMINOPHEN 5-325 MG PO TABS
1.0000 | ORAL_TABLET | Freq: Once | ORAL | Status: AC
  Administered 2015-04-03: 1 via ORAL
  Filled 2015-04-03: qty 1

## 2015-04-03 MED ORDER — OXYCODONE-ACETAMINOPHEN 5-325 MG PO TABS: 1.0000 | ORAL_TABLET | ORAL | Status: AC | PRN

## 2015-04-03 MED ORDER — AMOXICILLIN-POT CLAVULANATE 875-125 MG PO TABS: 1.0000 | ORAL_TABLET | Freq: Two times a day (BID) | ORAL | Status: AC

## 2015-04-03 MED ORDER — AMOXICILLIN-POT CLAVULANATE 875-125 MG PO TABS
1.0000 | ORAL_TABLET | Freq: Once | ORAL | Status: AC
  Administered 2015-04-03: 1 via ORAL
  Filled 2015-04-03: qty 1

## 2015-04-03 MED ORDER — KETOROLAC TROMETHAMINE 60 MG/2ML IM SOLN
60.0000 mg | Freq: Once | INTRAMUSCULAR | Status: AC
  Administered 2015-04-03: 60 mg via INTRAMUSCULAR
  Filled 2015-04-03: qty 2

## 2015-04-03 NOTE — ED Notes (Signed)
Pt c/o head cold that she has had for a week, bilateral ear pain that started today, right worse than left ear,

## 2015-04-03 NOTE — Discharge Instructions (Signed)
Otitis Media °Otitis media is redness, soreness, and puffiness (swelling) in the space just behind your eardrum (middle ear). It may be caused by allergies or infection. It often happens along with a cold. °HOME CARE °· Take your medicine as told. Finish it even if you start to feel better. °· Only take over-the-counter or prescription medicines for pain, discomfort, or fever as told by your doctor. °· Follow up with your doctor as told. °GET HELP IF: °· You have otitis media only in one ear, or bleeding from your nose, or both. °· You notice a lump on your neck. °· You are not getting better in 3-5 days. °· You feel worse instead of better. °GET HELP RIGHT AWAY IF:  °· You have pain that is not helped with medicine. °· You have puffiness, redness, or pain around your ear. °· You get a stiff neck. °· You cannot move part of your face (paralysis). °· You notice that the bone behind your ear hurts when you touch it. °MAKE SURE YOU:  °· Understand these instructions. °· Will watch your condition. °· Will get help right away if you are not doing well or get worse. °Document Released: 12/09/2007 Document Revised: 06/27/2013 Document Reviewed: 01/17/2013 °[employer]® Patient Information ©2015 [employer]. This information is not intended to replace advice given to you by your health care provider. Make sure you discuss any questions you have with your health care provider. ° °

## 2015-04-04 NOTE — ED Provider Notes (Signed)
CSN: [id number]     Arrival date & time 04/03/15  2032 History   First MD Initiated Contact with Patient 04/03/15 2052     Chief Complaint  Patient presents with  . Otalgia     (Consider location/radiation/quality/duration/timing/severity/associated sxs/prior Treatment) HPI  Angel Wood is a 44 y.o. female who presents to the Emergency Department complaining of bilateral ear pain and nasal congestion.  She reports having congestion for one week and ear pain began suddenly today.  She states the right ear is worse than the left and she describes the pain as sharp and severe, worse with cold.  She took a "left over" hydrocodone tablet earlier with minimal relief.  She also states that she has been very nauseated for several days and reports a few episodes of vomiting.  She denies fever, decreased hearing, facial pain or swelling or neck pain.  No reported cough, shortness of breath or chest pain.   Past Medical History  Diagnosis Date  . Migraines    Past Surgical History  Procedure Laterality Date  . Cholecystectomy    . Abdominal hysterectomy     No family history on file. Social History  Substance Use Topics  . Smoking status: Never Smoker   . Smokeless tobacco: Never Used  . Alcohol Use: No   OB History    Gravida Para Term Preterm AB TAB SAB Ectopic Multiple Living   Review of Systems  Constitutional: Negative for fever, chills, activity change and appetite change.  HENT: Positive for congestion and ear pain. Negative for ear discharge, facial swelling, rhinorrhea, sore throat and trouble swallowing.   Eyes: Negative for visual disturbance.  Respiratory: Positive for cough. Negative for chest tightness, shortness of breath, wheezing and stridor.   Gastrointestinal: Positive for nausea and vomiting. Negative for abdominal pain.  Musculoskeletal: Negative for neck pain and neck stiffness.  Skin: Negative.   Neurological: Negative for dizziness,  speech difficulty, weakness, numbness and headaches.  Hematological: Negative for adenopathy.  Psychiatric/Behavioral: Negative for confusion.  All other systems reviewed and are negative.     Allergies  Review of patient's allergies indicates no known allergies.  Home Medications   Prior to Admission medications   Medication Sig Start Date End Date Taking? Authorizing Provider  ALPRAZolam Prudy Feeler) 1 MG tablet Take 1 mg by mouth at bedtime as needed for sleep.   Yes Historical Provider, MD  BIOTIN PO Take 1 capsule by mouth daily.   Yes Historical Provider, MD  Cholecalciferol (VITAMIN D) 2000 UNITS CAPS Take 1 capsule by mouth daily.   Yes Historical Provider, MD  Coconut Oil 1000 MG CAPS Take 1 capsule by mouth daily.   Yes Historical Provider, MD  FLUoxetine (PROZAC) 20 MG capsule Take 20 mg by mouth daily.   Yes Historical Provider, MD  HYDROcodone-acetaminophen (NORCO) 10-325 MG tablet Take 1 tablet by mouth daily as needed for moderate pain or severe pain.   Yes Historical Provider, MD  Multiple Vitamin (MULTIVITAMIN WITH MINERALS) TABS tablet Take 1 tablet by mouth daily.   Yes Historical Provider, MD  SUMAtriptan (IMITREX) 100 MG tablet Take 100 mg by mouth every 2 (two) hours as needed. For migraines   Yes Historical Provider, MD  topiramate (TOPAMAX) 100 MG tablet Take 100 mg by mouth daily.   Yes Historical Provider, MD  amoxicillin-clavulanate (AUGMENTIN) 875-125 MG tablet Take 1 tablet by mouth every 12 (twelve) hours. For  7 days 04/03/15   Tammy Triplett, PA-C  ondansetron (ZOFRAN) 4 MG tablet Take 1 tablet (4 mg total) by mouth every 6 (six) hours. 04/03/15   Tammy Triplett, PA-C  oxyCODONE-acetaminophen (PERCOCET/ROXICET) 5-325 MG tablet Take 1 tablet by mouth every 4 (four) hours as needed. 04/03/15   Tammy Triplett, PA-C   BP 132/81 mmHg  Pulse 102  Temp(Src) 98.2 F (36.8 C) (Oral)  Resp 18  Ht  (1.676 m)  Wt 136 lb 6 oz (61.859 kg)  BMI 22.02 kg/m2  SpO2  100% Physical Exam  Constitutional: She is oriented to person, place, and time. She appears well-developed and well-nourished. No distress.  Uncomfortable appearing, holding her hands over both ears  HENT:  Head: Normocephalic and atraumatic.  Mouth/Throat: Uvula is midline, oropharynx is clear and moist and mucous membranes are normal. No uvula swelling. No oropharyngeal exudate.  Erythema, bulging of the right TM.  Mild erythema of the left TM.  No effusion present.  No drainage or edema of the ear canal, TMs appear intact.    Neck: Normal range of motion. Neck supple.  Cardiovascular: Normal rate, regular rhythm and normal heart sounds.   No murmur heard. Pulmonary/Chest: Effort normal and breath sounds normal. No stridor. No respiratory distress. She has no wheezes. She has no rales.  Abdominal: Soft. She exhibits no distension. There is no tenderness. There is no rebound and no guarding.  Musculoskeletal: Normal range of motion.  Lymphadenopathy:    She has no cervical adenopathy.  Neurological: She is alert and oriented to person, place, and time. Coordination normal.  Skin: Skin is warm and dry. No rash noted.  Psychiatric: She has a normal mood and affect.  Nursing note and vitals reviewed.   ED Course  Procedures (including critical care time)   MDM   Final diagnoses:  Acute nonsuppurative otitis media of both ears    Vitals stable.  Pt is non-toxic appearing.  Mucous membranes moist.    Pt is feeling better after medications.  Nausea remains but no active vomiting during ED stay.  abd is soft, NT.  No concerning sx's for acute abdomen.  Significant OM on right with milder sx's on left.  No perforation at this time.  I have advised pt to return here for any worsening sx's and otherwise f/u with ENT or PMD for recheck in few days.  She agrees to plan and appears stable for d/c     Pauline Aus, PA-C 04/04/15 0106  Glynn Octave, MD 04/04/15 [phone]

## 2015-04-08 MED FILL — Hydrocodone-Acetaminophen Tab 5-325 MG: ORAL | Qty: 6 | Status: AC

## 2015-06-16 IMAGING — CR DG CERVICAL SPINE COMPLETE 4+V
5 series · 5 of 5 positions shown · non-contrast
Comparison: MR ASTOU SPINE W/O CM dated 07/23/2008; DG CERVICAL SPINE
COMPLETE dated 07/16/2008

CLINICAL DATA: Left neck pain following trauma 5 days ago.

EXAM:
CERVICAL SPINE  4+ VIEWS

[view not recorded (1 of 5)]
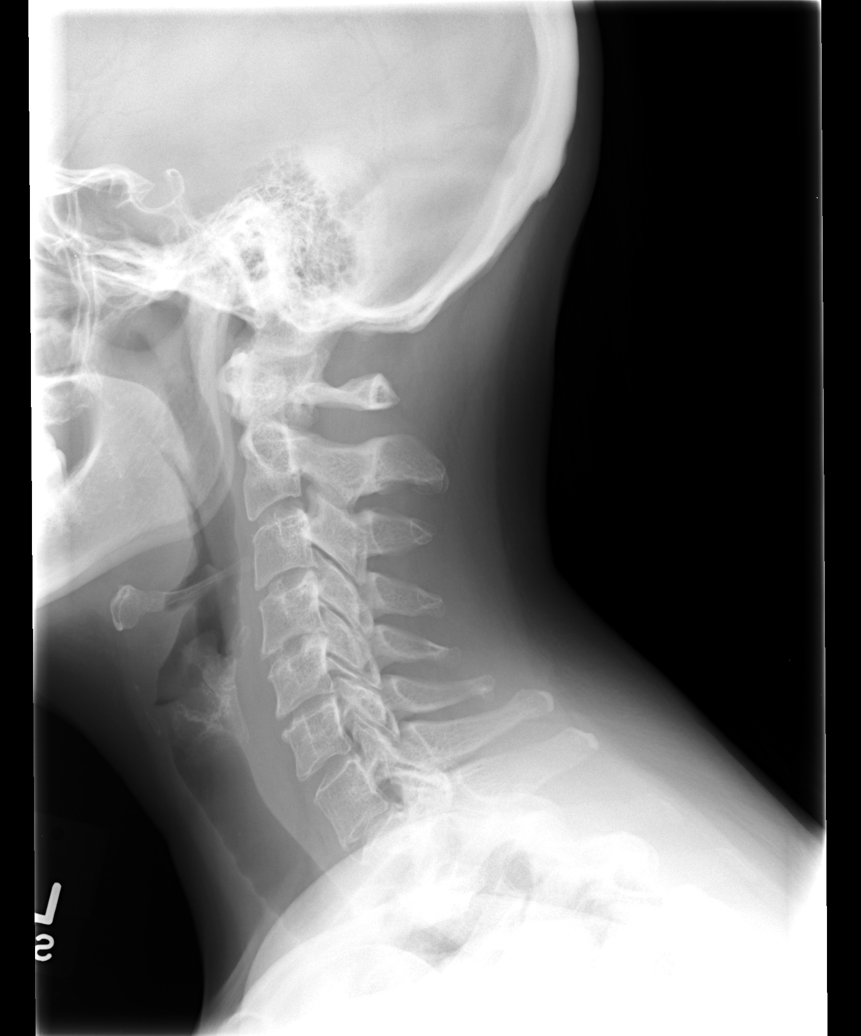

[view not recorded (2 of 5)]
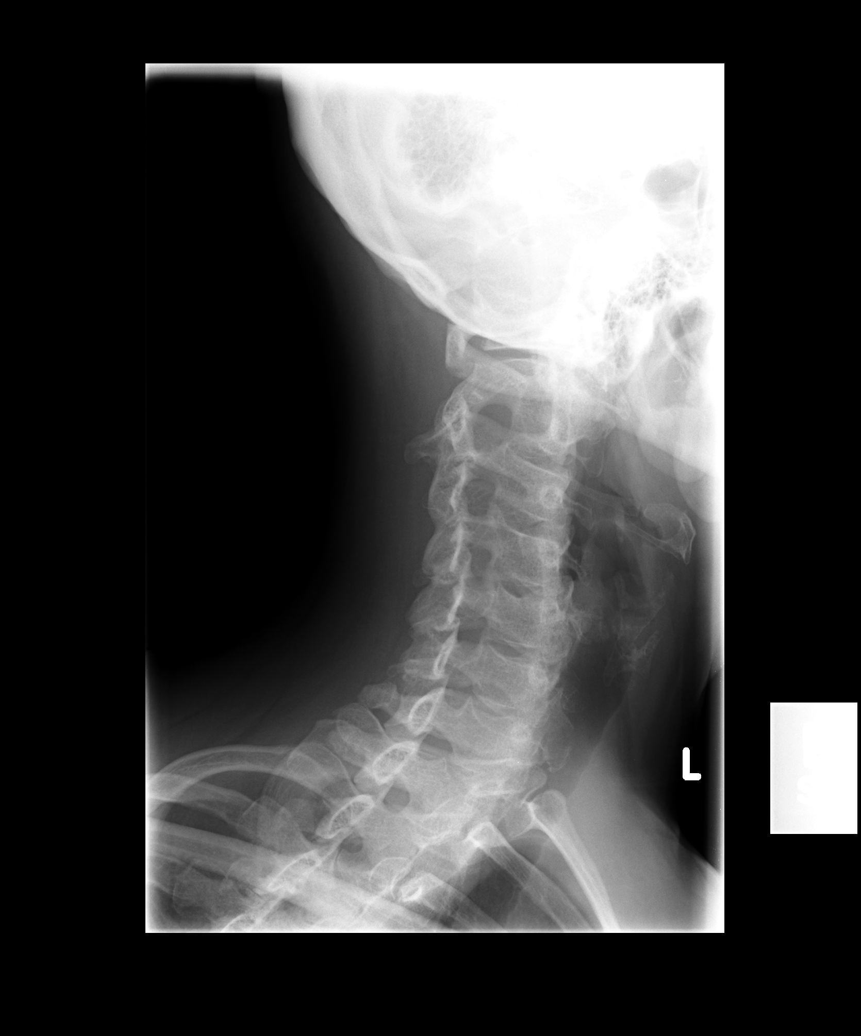

[view not recorded (3 of 5)]
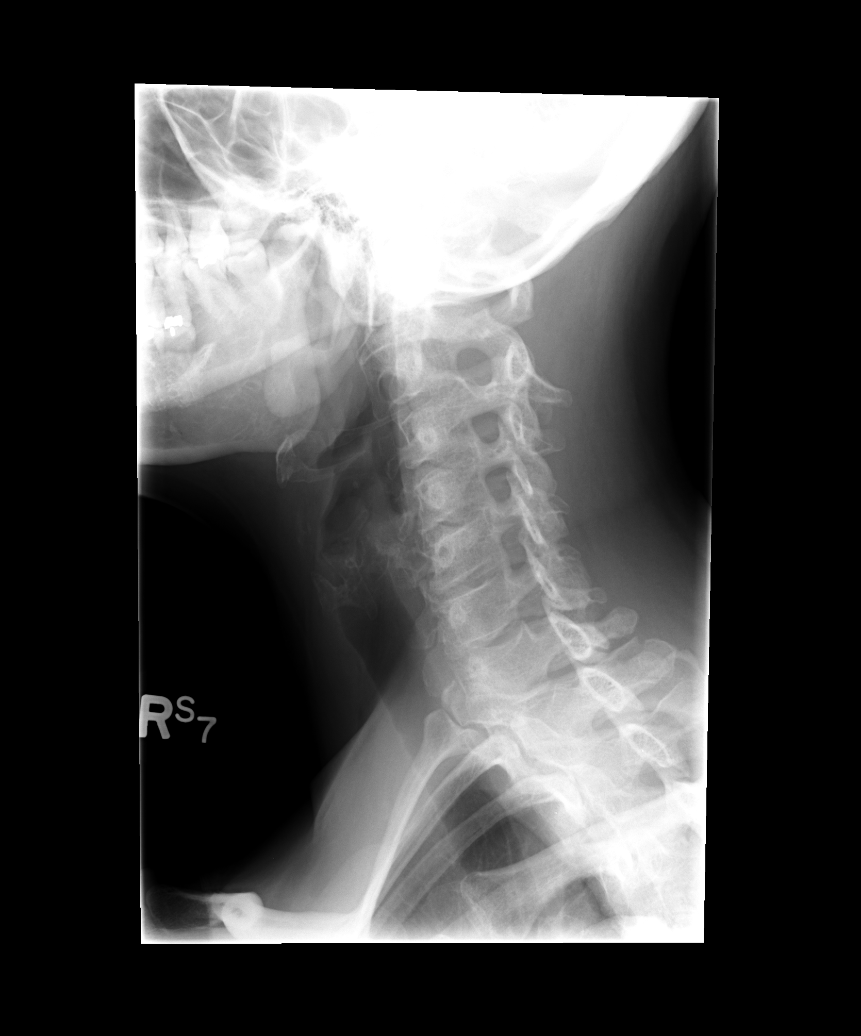

[view not recorded (4 of 5)]
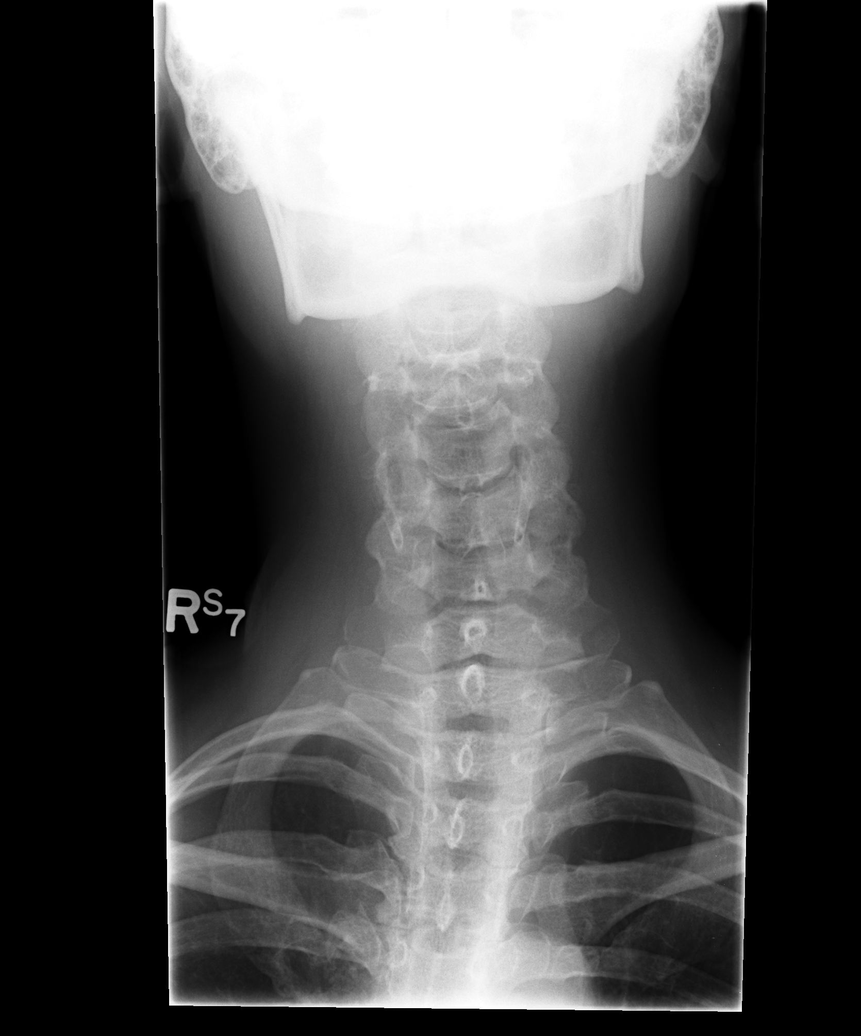

[view not recorded (5 of 5)]
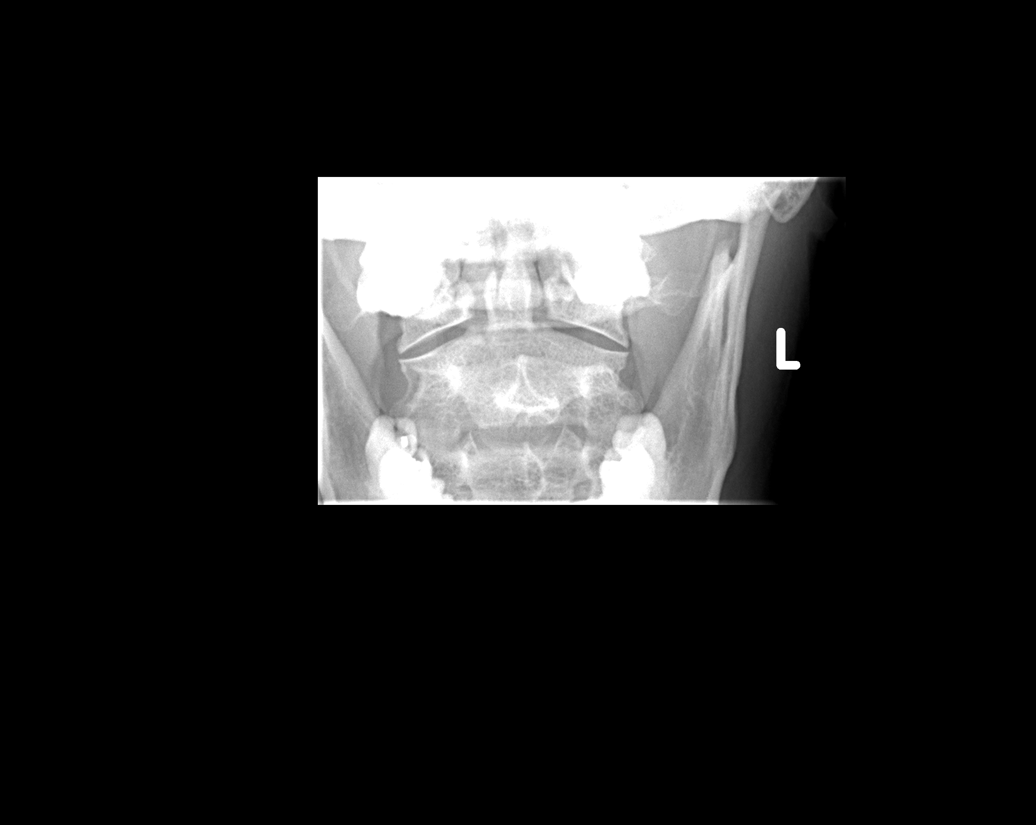

[5 of 5 positions shown; findings below may reference images not displayed]

FINDINGS: The prevertebral soft tissues are normal. The alignment is anatomic
through T1. There is no evidence of acute fracture or traumatic
subluxation. The C1-2 articulation appears normal in the AP
projection. There is mildly progressive disc space loss with
uncinate spurring at C4-5 and C5-6. There is no high-grade osseous
foraminal stenosis.
IMPRESSION: Mild spondylosis at C4-5 and C5-6. No evidence of acute cervical
spine fracture, traumatic subluxation or static signs of
instability.

## 2015-06-16 IMAGING — CT CT CHEST W/ CM
2 of 3 series · 15 of 36 positions shown, 18 images · IV contrast (Omnipaque 300)
Comparison: None.

CLINICAL DATA: Chest pain following recent injury, known sternal
fracture

EXAM:
CT CHEST WITH CONTRAST
TECHNIQUE: Multidetector CT imaging of the chest was performed during
intravenous contrast administration.
CONTRAST:  80mL OMNIPAQUE IOHEXOL 300 MG/ML  SOLN

[Series 2: chestroutine 5.0 b40f · axial · 0.72mm/px · z∈[-284,-14]mm · 12 of 64 slices shown, 15 images]
[im 5/64  mediastinal]
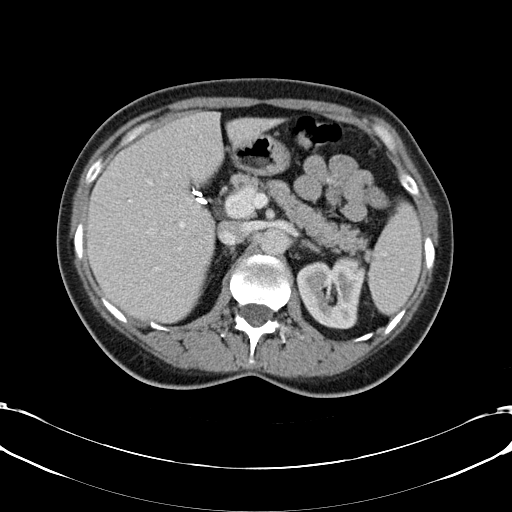
[im 5/64  lung]
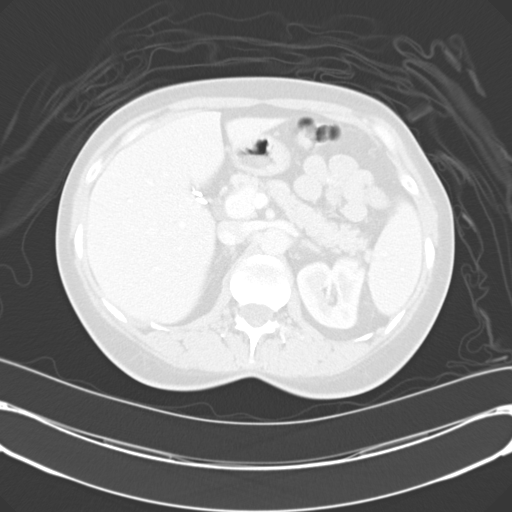
[im 10/64  lung]
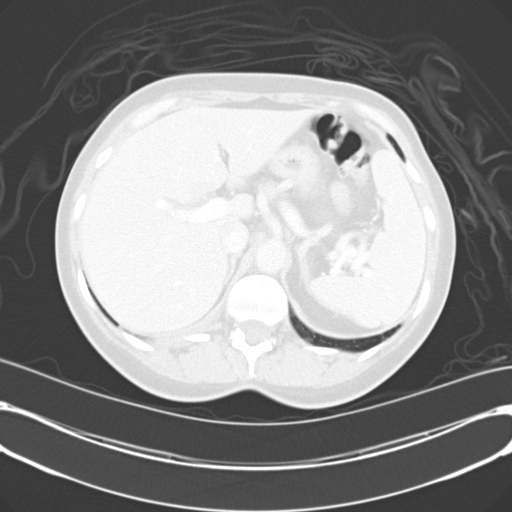
[im 15/64  lung]
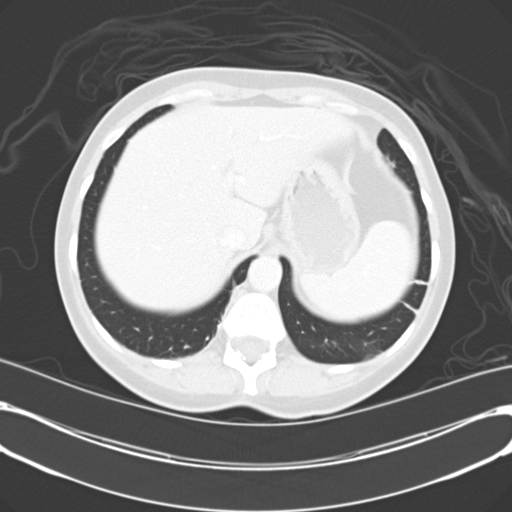
[im 19/64  lung]
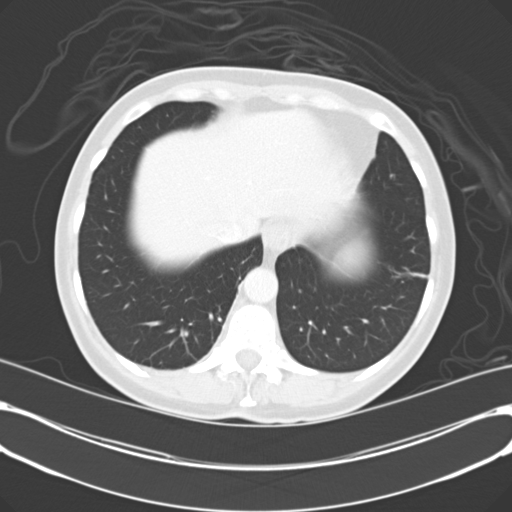
[im 24/64  mediastinal]
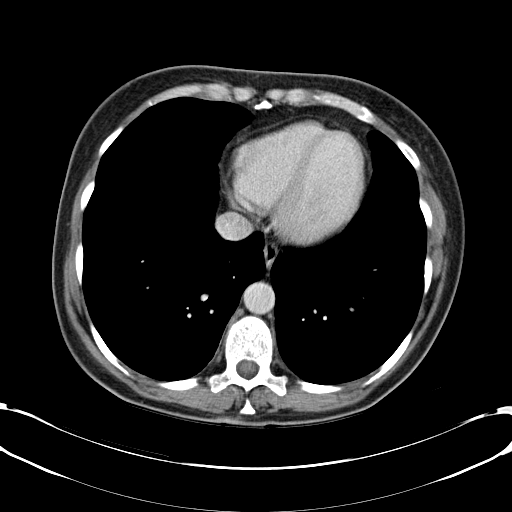
[im 24/64  lung]
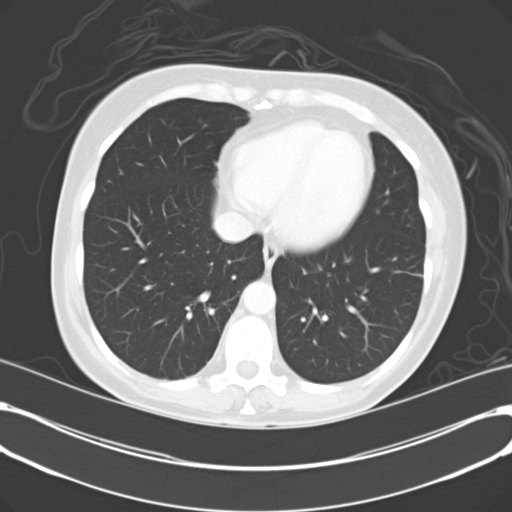
[im 29/64  lung]
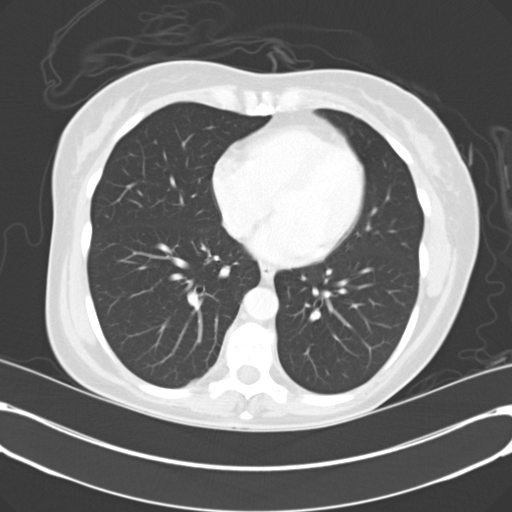
[im 36/64  lung]
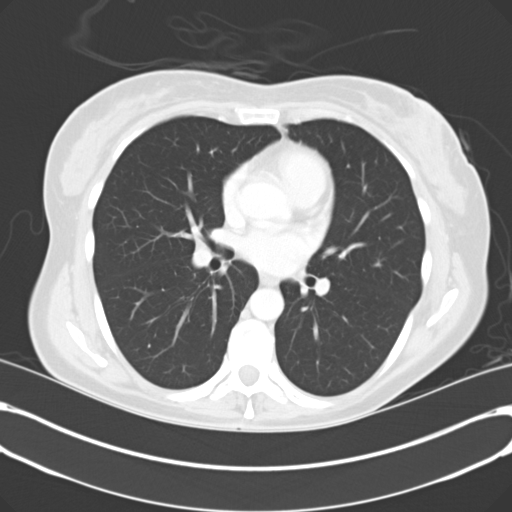
[im 40/64  lung]
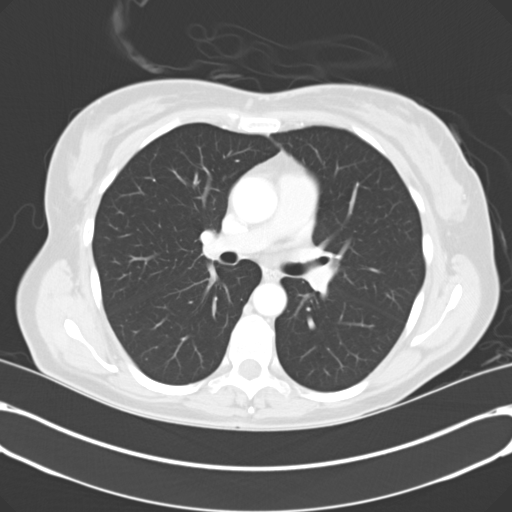
[im 45/64  mediastinal]
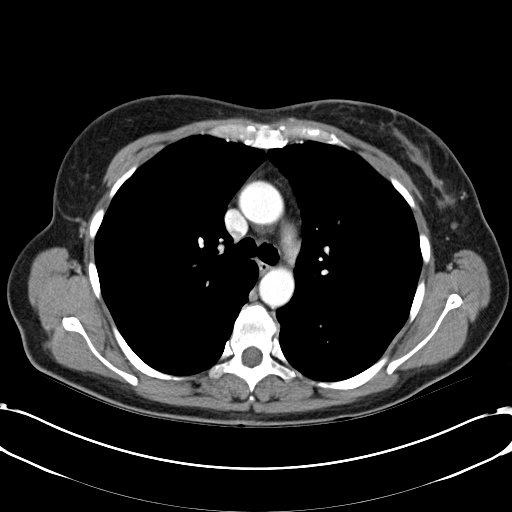
[im 45/64  lung]
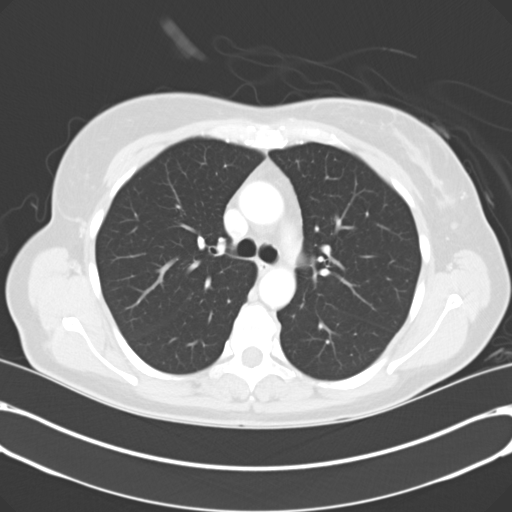
[im 50/64  lung]
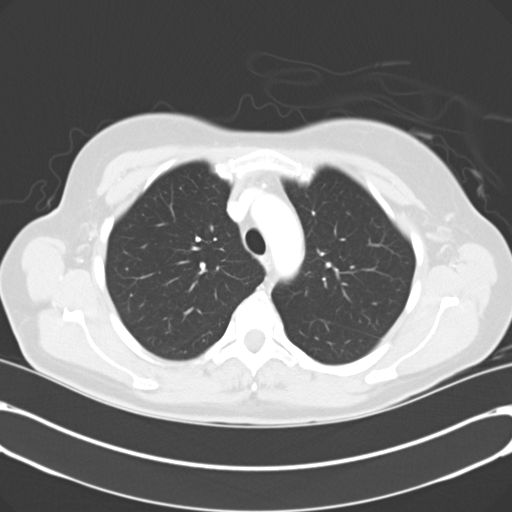
[im 54/64  lung]
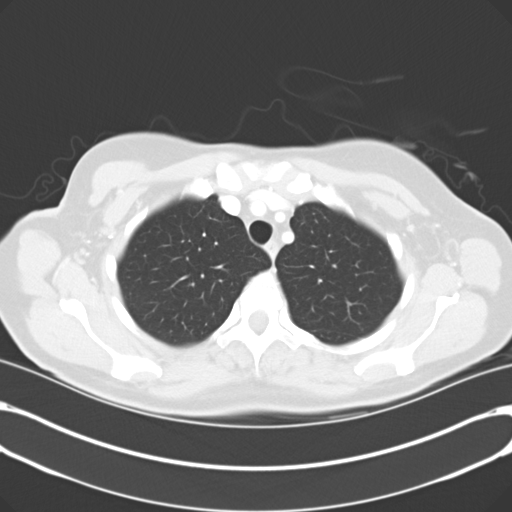
[im 59/64  lung]
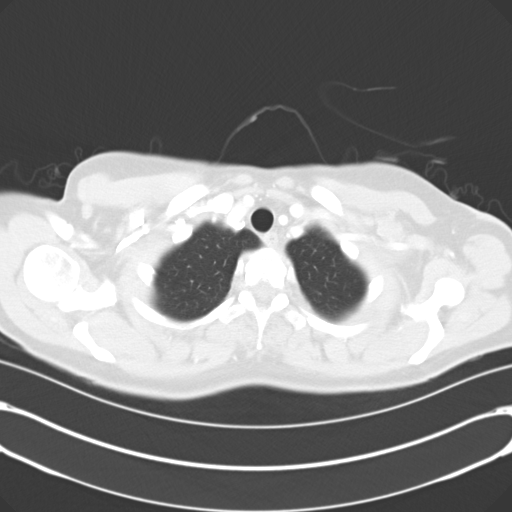

[Series 4: mpr coronal chest 3mm · coronal · 0.68mm/px · 3 of 86 slices shown]
[im 18/86  lung]
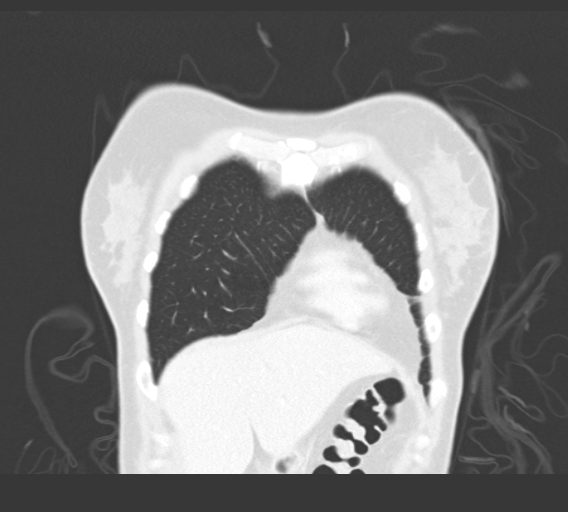
[im 35/86  lung]
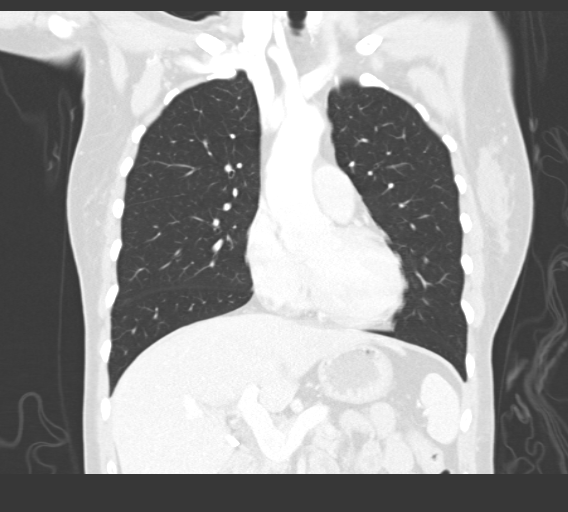
[im 52/86  lung]
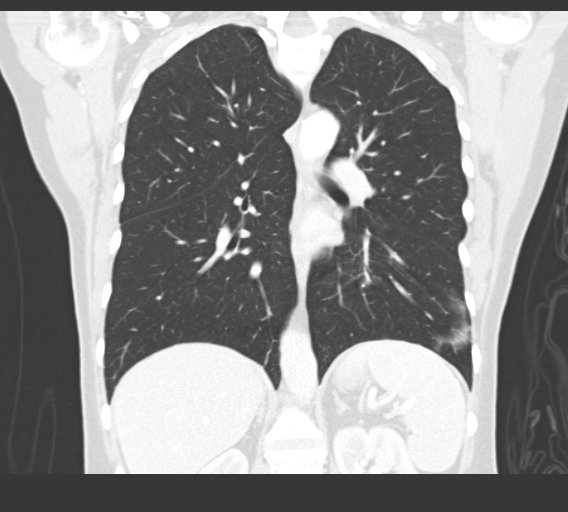

[15 of 36 positions shown; findings below may reference images not displayed]

FINDINGS: The lungs are well aerated bilaterally without focal infiltrate or
sizable effusion. No pneumothorax is noted. The thoracic inlet is
within normal limits. An undisplaced proximal sternal fracture is
seen. No mediastinal hematoma is seen. No underlying vascular
abnormality is noted. The visualized portions of the upper abdomen
are within normal limits. No other bony abnormality is seen.
IMPRESSION: Undisplaced proximal sternal fracture. No other focal abnormality is
noted.
